# Patient Record
Sex: Male | Born: 1970 | Race: Black or African American | Hispanic: No | Marital: Married | Smoking: Never smoker
Health system: Southern US, Community
[De-identification: ages and names within clinical notes are randomized; demographics above are authoritative.]

## PROBLEM LIST (undated history)

## (undated) DIAGNOSIS — T7840XA Allergy, unspecified, initial encounter: Secondary | ICD-10-CM

## (undated) DIAGNOSIS — Z8619 Personal history of other infectious and parasitic diseases: Secondary | ICD-10-CM

## (undated) DIAGNOSIS — I10 Essential (primary) hypertension: Secondary | ICD-10-CM

## (undated) HISTORY — PX: WISDOM TOOTH EXTRACTION: SHX21

## (undated) HISTORY — PX: NASAL SINUS SURGERY: SHX719

## (undated) HISTORY — DX: Allergy, unspecified, initial encounter: T78.40XA

## (undated) HISTORY — DX: Personal history of other infectious and parasitic diseases: Z86.19

## (undated) HISTORY — DX: Essential (primary) hypertension: I10

---

## 1999-10-31 ENCOUNTER — Emergency Department (HOSPITAL_COMMUNITY): Admission: EM | Admit: 1999-10-31 | Discharge: 1999-10-31 | Payer: Self-pay | Admitting: Emergency Medicine

## 2004-08-29 ENCOUNTER — Ambulatory Visit (HOSPITAL_COMMUNITY): Admission: RE | Admit: 2004-08-29 | Discharge: 2004-08-29 | Payer: Self-pay | Admitting: Trauma Surgery

## 2005-07-11 ENCOUNTER — Emergency Department (HOSPITAL_COMMUNITY): Admission: EM | Admit: 2005-07-11 | Discharge: 2005-07-12 | Payer: Self-pay | Admitting: *Deleted

## 2006-03-12 ENCOUNTER — Ambulatory Visit: Payer: Self-pay | Admitting: Family Medicine

## 2006-03-12 LAB — CONVERTED CEMR LAB
BUN: 11 mg/dL (ref 6–23)
CO2: 32 meq/L (ref 19–32)
GFR calc non Af Amer: 81 mL/min
Glomerular Filtration Rate, Af Am: 99 mL/min/{1.73_m2}
Glucose, Bld: 85 mg/dL (ref 70–99)

## 2008-06-02 ENCOUNTER — Ambulatory Visit: Payer: Self-pay | Admitting: Family Medicine

## 2008-06-02 ENCOUNTER — Encounter (INDEPENDENT_AMBULATORY_CARE_PROVIDER_SITE_OTHER): Payer: Self-pay | Admitting: *Deleted

## 2008-06-02 DIAGNOSIS — I10 Essential (primary) hypertension: Secondary | ICD-10-CM | POA: Insufficient documentation

## 2008-06-02 DIAGNOSIS — Z9189 Other specified personal risk factors, not elsewhere classified: Secondary | ICD-10-CM | POA: Insufficient documentation

## 2008-06-02 HISTORY — DX: Other specified personal risk factors, not elsewhere classified: Z91.89

## 2008-06-03 ENCOUNTER — Encounter: Payer: Self-pay | Admitting: Family Medicine

## 2008-06-07 ENCOUNTER — Ambulatory Visit: Payer: Self-pay | Admitting: Pulmonary Disease

## 2008-06-14 ENCOUNTER — Encounter: Payer: Self-pay | Admitting: Pulmonary Disease

## 2008-06-14 ENCOUNTER — Ambulatory Visit (HOSPITAL_BASED_OUTPATIENT_CLINIC_OR_DEPARTMENT_OTHER): Admission: RE | Admit: 2008-06-14 | Discharge: 2008-06-14 | Payer: Self-pay | Admitting: Pulmonary Disease

## 2008-06-23 ENCOUNTER — Ambulatory Visit: Payer: Self-pay | Admitting: Family Medicine

## 2008-06-24 ENCOUNTER — Ambulatory Visit: Payer: Self-pay | Admitting: Pulmonary Disease

## 2008-06-28 ENCOUNTER — Telehealth (INDEPENDENT_AMBULATORY_CARE_PROVIDER_SITE_OTHER): Payer: Self-pay | Admitting: *Deleted

## 2008-07-08 ENCOUNTER — Ambulatory Visit: Payer: Self-pay | Admitting: Family Medicine

## 2008-07-08 LAB — CONVERTED CEMR LAB
Alkaline Phosphatase: 57 units/L (ref 39–117)
BUN: 12 mg/dL (ref 6–23)
Bilirubin, Direct: 0.1 mg/dL (ref 0.0–0.3)
CO2: 32 meq/L (ref 19–32)
Calcium: 9.1 mg/dL (ref 8.4–10.5)
Chloride: 103 meq/L (ref 96–112)
Cholesterol: 179 mg/dL (ref 0–200)
Creatinine, Ser: 1.2 mg/dL (ref 0.4–1.5)
Glucose, Bld: 104 mg/dL — ABNORMAL HIGH (ref 70–99)
Sodium: 139 meq/L (ref 135–145)

## 2008-07-14 ENCOUNTER — Ambulatory Visit: Payer: Self-pay | Admitting: Pulmonary Disease

## 2008-07-14 DIAGNOSIS — G4733 Obstructive sleep apnea (adult) (pediatric): Secondary | ICD-10-CM | POA: Insufficient documentation

## 2008-07-14 HISTORY — DX: Obstructive sleep apnea (adult) (pediatric): G47.33

## 2008-07-29 ENCOUNTER — Ambulatory Visit: Payer: Self-pay | Admitting: Family Medicine

## 2008-10-07 ENCOUNTER — Ambulatory Visit: Payer: Self-pay | Admitting: Family Medicine

## 2008-10-13 ENCOUNTER — Ambulatory Visit: Payer: Self-pay | Admitting: Family Medicine

## 2008-10-13 LAB — CONVERTED CEMR LAB
ALT: 24 units/L (ref 0–53)
Albumin: 4.1 g/dL (ref 3.5–5.2)
Bilirubin, Direct: 0 mg/dL (ref 0.0–0.3)
Calcium: 9 mg/dL (ref 8.4–10.5)
Chloride: 106 meq/L (ref 96–112)
Creatinine, Ser: 1.3 mg/dL (ref 0.4–1.5)
GFR calc non Af Amer: 79.95 mL/min (ref >60)
HDL: 33.4 mg/dL — ABNORMAL LOW (ref >39.00)
LDL Cholesterol: 119 mg/dL — ABNORMAL HIGH (ref 0–99)
Potassium: 4 meq/L (ref 3.5–5.1)
Total CHOL/HDL Ratio: 5
Triglycerides: 72 mg/dL (ref 0.0–149.0)

## 2008-10-27 ENCOUNTER — Ambulatory Visit: Payer: Self-pay | Admitting: Family Medicine

## 2010-05-21 ENCOUNTER — Emergency Department (HOSPITAL_BASED_OUTPATIENT_CLINIC_OR_DEPARTMENT_OTHER)
Admission: EM | Admit: 2010-05-21 | Discharge: 2010-05-21 | Payer: Self-pay | Source: Home / Self Care | Admitting: Emergency Medicine

## 2010-05-31 ENCOUNTER — Encounter: Payer: Self-pay | Admitting: Internal Medicine

## 2010-05-31 ENCOUNTER — Ambulatory Visit
Admission: RE | Admit: 2010-05-31 | Discharge: 2010-05-31 | Payer: Self-pay | Source: Home / Self Care | Attending: Internal Medicine | Admitting: Internal Medicine

## 2010-05-31 DIAGNOSIS — B350 Tinea barbae and tinea capitis: Secondary | ICD-10-CM | POA: Insufficient documentation

## 2010-05-31 HISTORY — DX: Tinea barbae and tinea capitis: B35.0

## 2010-06-27 ENCOUNTER — Encounter: Payer: Self-pay | Admitting: Internal Medicine

## 2010-06-28 ENCOUNTER — Encounter: Payer: Self-pay | Admitting: Internal Medicine

## 2010-06-28 LAB — CONVERTED CEMR LAB
ALT: 27 units/L (ref 0–53)
AST: 26 units/L (ref 0–37)
Albumin: 4.4 g/dL (ref 3.5–5.2)
Alkaline Phosphatase: 65 units/L (ref 39–117)
Bilirubin, Direct: 0.1 mg/dL (ref 0.0–0.3)
Chloride: 102 meq/L (ref 96–112)
Glucose, Bld: 93 mg/dL (ref 70–99)
LDL Cholesterol: 112 mg/dL — ABNORMAL HIGH (ref 0–99)
Potassium: 3.7 meq/L (ref 3.5–5.3)
Sodium: 140 meq/L (ref 135–145)
Total Bilirubin: 0.4 mg/dL (ref 0.3–1.2)
Total Protein: 7.4 g/dL (ref 6.0–8.3)
VLDL: 20 mg/dL (ref 0–40)

## 2010-06-28 NOTE — Assessment & Plan Note (Signed)
Summary: NEW TO BE EST [facility] WENT TO [facility]   Vital Signs:  Patient profile:   40 year old male Height:      72 inches Weight:      235.31 pounds BMI:     32.03 Temp:     97.8 degrees F oral Pulse rate:   96 / minute Pulse rhythm:   regular Resp:     16 per minute BP sitting:   172 / 118  (right arm) Cuff size:   large  Vitals Entered By: Mervin Kung CMA (AAMA) (May 31, 2010 11:08 AM) CC: Pt here to establish care. Referred by ED for elevated BP. Is Patient Diabetic? No Pain Assessment Patient in pain? no      Comments Pt has history of hypertension. Had stopped all meds as he had decreased some stress in his life. BP check at home were reported normal until recently.  Mervin Kung CMA Duncan Dull)  May 31, 2010 11:16 AM    Primary Care Provider:  Dondra Spry DO  CC:  Pt here to establish care. Referred by ED for elevated BP.Marland Kitchen  History of Present Illness: 40 y/o male to establish  hx of htn - started meds 10 yrs ago  Dec - stepfather was hospitalized increased stress  occ takes aleve for headaches  Preventive Screening-Counseling & Management  Alcohol-Tobacco     Alcohol drinks/day: 0     Smoking Status: never  Caffeine-Diet-Exercise     Caffeine use/day: none     Does Patient Exercise: no  Allergies (verified): No Known Drug Allergies  Past History:  Past Surgical History: Sinus surgery-- 1996     Family History: Family History Hypertension M uncle--MI in 62s  mother and father in mid 2's - healthy no cancer  Social History: Occupation: Engineer, drilling Married -9 yrs [place] up [place] 3 children ( 2 daughter  2, 39, son 5) Never Smoked Alcohol use-no  Drug use-no Regular exercise-no Caffeine use/day:  none  Review of Systems  The patient denies weight loss, chest pain, syncope, dyspnea on exertion, prolonged cough, abdominal pain, melena, hematochezia, severe indigestion/heartburn, and depression.     Physical Exam  General:  alert, well-developed, and well-nourished.   Head:  normocephalic and atraumatic.   Eyes:  pupils equal, pupils round, and pupils reactive to light.   Ears:  R ear normal and L ear normal.   Mouth:  pharynx pink and moist.   Neck:  No deformities, masses, or tenderness noted.no carotid bruits.   Lungs:  normal respiratory effort, normal breath sounds, no crackles, and no wheezes.   Heart:  normal rate, regular rhythm, no murmur, and no gallop.   Abdomen:  soft, non-tender, normal bowel sounds, no masses, and no guarding.   Neurologic:  cranial nerves II-XII intact and gait normal.   Skin:  scattered ruddy appearance of bilateral face    Impression & Recommendations:  Problem # 1:  HYPERTENSION (ICD-401.9) restart meds diovan / hct The following medications were removed from the medication list:    Valturna 300-320 Mg Tabs (Aliskiren-valsartan) .Marland Kitchen... 1 by mouth once daily    Hydrochlorothiazide 25 Mg Tabs (Hydrochlorothiazide) .Marland Kitchen... 1 by mouth once daily His updated medication list for this problem includes:    Diovan Hct 160-25 Mg Tabs (Valsartan-hydrochlorothiazide) ..... One by mouth once daily  Orders: EKG w/ Interpretation (93000)  BP today: 172/118 Prior BP: 130/90 (10/27/2008)  Labs Reviewed: K+: 4.0 (10/07/2008) Creat: : 1.3 (  10/07/2008)   Chol: 167 (10/07/2008)   HDL: 33.40 (10/07/2008)   LDL: 119 (10/07/2008)   TG: 72.0 (10/07/2008)  Problem # 2:  TINEA BARBAE (ICD-110.0)  Complete Medication List: 1)  Diovan Hct 160-25 Mg Tabs (Valsartan-hydrochlorothiazide) .... One by mouth once daily 2)  Metronidazole 0.75 % Gel (Metronidazole) .... Apply twice daily  Patient Instructions: 1)  Please schedule a follow-up appointment in 1 month. 2)  BMP prior to visit, ICD-9: 401.9 3)  Hepatic Panel prior to visit, ICD-9: 401.9 4)  Lipid Panel prior to visit, ICD-9: 401.9 5)  HbgA1C prior to visit,  ICD -9:  790.29 6)  Please return for lab work  one (1) week before your next appointment.  Prescriptions: METRONIDAZOLE 0.75 % GEL (METRONIDAZOLE) apply twice daily  #1 month x 2   Entered and Authorized by:   D. Thomos Lemons DO   Signed by:   D. Thomos Lemons DO on 05/31/2010   Method used:   Electronically to        [facility] [address] [phone]* (retail)       [address]       [place], [place]  [address]       Ph: [phone] or [phone]       Fax: [phone]   RxID:   [id number] DIOVAN HCT 160-25 MG TABS (VALSARTAN-HYDROCHLOROTHIAZIDE) one by mouth once daily  #90 x 1   Entered and Authorized by:   D. Thomos Lemons DO   Signed by:   D. Thomos Lemons DO on 05/31/2010   Method used:   Print then Give to Patient   RxID:   [id number]    Orders Added: 1)  EKG w/ Interpretation [93000] 2)  New Patient Level III [99203]    Contraindications/Deferment of Procedures/Staging:    Test/Procedure: FLU VAX    Reason for deferment: patient declined    Preventive Care Screening     Pt unsure of last tetanus shot. Nicki Guadalajara Fergerson CMA (AAMA)  May 31, 2010 11:20 AM     Current Allergies (reviewed today): No known allergies

## 2010-07-02 ENCOUNTER — Encounter: Payer: Self-pay | Admitting: Internal Medicine

## 2010-07-02 ENCOUNTER — Ambulatory Visit (INDEPENDENT_AMBULATORY_CARE_PROVIDER_SITE_OTHER): Payer: BC Managed Care – PPO | Admitting: Internal Medicine

## 2010-07-02 DIAGNOSIS — I10 Essential (primary) hypertension: Secondary | ICD-10-CM

## 2010-07-04 NOTE — Miscellaneous (Signed)
Summary: lab order  Clinical Lists Changes  Orders: Added new Test order of T-Basic Metabolic Panel [phone]) - Signed Added new Test order of T-Hepatic Function [phone]) - Signed Added new Test order of T-Lipid Profile [phone]) - Signed Added new Test order of T- Hemoglobin A1C ([id number]) - Signed

## 2010-07-24 NOTE — Assessment & Plan Note (Signed)
Summary: 1 month follow up hea   Vital Signs:  Patient profile:   40 year old male Height:      72 inches Weight:      232 pounds BMI:     31.58 O2 Sat:      100 % on Room air Pulse rate:   52 / minute Pulse rhythm:   regular Resp:     20 per minute BP sitting:   152 / 104  (left arm) Cuff size:   large  Vitals Entered By: Francee Piccolo CMA Duncan Dull) (July 02, 2010 9:58 AM)  O2 Flow:  Room air CC: follow-up visit for BP, pt ran out of meds on Saturday, BP continues to be elevated on home monitor Is Patient Diabetic? No   Primary Care Provider:  Dondra Spry DO  CC:  follow-up visit for BP, pt ran out of meds on Saturday, and BP continues to be elevated on home monitor.  History of Present Illness:  Hypertension Follow-Up      This is a 40 year old man who presents for Hypertension follow-up.  The patient denies lightheadedness and headaches.  The patient denies the following associated symptoms: chest pain.  He ran out of sample bp meds on sat.  he never got rx filled for diovan / hctz.  he lost rx he was not sure whether we were going to continue diovan because it did't completely control his BP  tinea barbae - improved with using metronidazole gel  Current Medications (verified): 1)  Diovan Hct 160-25 Mg Tabs (Valsartan-Hydrochlorothiazide) .... One By Mouth Once Daily  Allergies (verified): No Known Drug Allergies  Past History:  Past Medical History: Hypertension allergic rhinitis   Past Surgical History: Sinus surgery-- 1996      Family History: Family History Hypertension M uncle--MI in 61s  mother and father in mid 55's - healthy no cancer   Social History: Occupation: Engineer, drilling Married -9 yrs Grew up in [place] 3 children ( 2 daughter  73, 43, son 5)  Never Smoked Alcohol use-no  Drug use-no Regular exercise-no  Physical Exam  General:  alert, well-developed, and well-nourished.   Lungs:  normal respiratory  effort, normal breath sounds, no crackles, and no wheezes.   Heart:  normal rate, regular rhythm, no murmur, and no gallop.   Extremities:  No lower extremity edema    Impression & Recommendations:  Problem # 1:  HYPERTENSION (ICD-401.9) Assessment Improved add amlodipine  His updated medication list for this problem includes:    Diovan Hct 160-25 Mg Tabs (Valsartan-hydrochlorothiazide) ..... One by mouth once daily    Amlodipine Besylate 5 Mg Tabs (Amlodipine besylate) ..... One by mouth once daily  BP today: 152/104 Prior BP: 172/118 (05/31/2010)  Labs Reviewed: K+: 3.7 (06/28/2010) Creat: : 1.40 (06/28/2010)   Chol: 172 (06/28/2010)   HDL: 40 (06/28/2010)   LDL: 112 (06/28/2010)   TG: 101 (06/28/2010)  Complete Medication List: 1)  Diovan Hct 160-25 Mg Tabs (Valsartan-hydrochlorothiazide) .... One by mouth once daily 2)  Amlodipine Besylate 5 Mg Tabs (Amlodipine besylate) .... One by mouth once daily  Patient Instructions: 1)  Please schedule a follow-up appointment in 2 months. Prescriptions: AMLODIPINE BESYLATE 5 MG TABS (AMLODIPINE BESYLATE) one by mouth once daily  #30 x 3   Entered and Authorized by:   D. Thomos Lemons DO   Signed by:   D. Thomos Lemons DO on 07/02/2010   Method used:   Print then Give  to Patient   RxID:   [id number] DIOVAN HCT 160-25 MG TABS (VALSARTAN-HYDROCHLOROTHIAZIDE) one by mouth once daily  #90 x 1   Entered and Authorized by:   D. Thomos Lemons DO   Signed by:   D. Thomos Lemons DO on 07/02/2010   Method used:   Print then Give to Patient   RxID:   [id number]    Orders Added: 1)  Est. Patient Level III [76283]

## 2010-08-06 LAB — BASIC METABOLIC PANEL
Calcium: 9.6 mg/dL (ref 8.4–10.5)
GFR calc Af Amer: >60 mL/min (ref >60)
GFR calc non Af Amer: 57 mL/min — ABNORMAL LOW (ref >60)
Potassium: 4.1 mEq/L (ref 3.5–5.1)
Sodium: 143 mEq/L (ref 135–145)

## 2010-08-09 ENCOUNTER — Encounter: Payer: Self-pay | Admitting: Internal Medicine

## 2010-08-29 ENCOUNTER — Encounter: Payer: Self-pay | Admitting: Internal Medicine

## 2010-08-29 ENCOUNTER — Other Ambulatory Visit: Payer: Self-pay | Admitting: Internal Medicine

## 2010-08-29 ENCOUNTER — Ambulatory Visit (INDEPENDENT_AMBULATORY_CARE_PROVIDER_SITE_OTHER): Payer: BC Managed Care – PPO | Admitting: Internal Medicine

## 2010-08-29 DIAGNOSIS — I10 Essential (primary) hypertension: Secondary | ICD-10-CM

## 2010-08-29 DIAGNOSIS — E785 Hyperlipidemia, unspecified: Secondary | ICD-10-CM

## 2010-08-29 HISTORY — DX: Hyperlipidemia, unspecified: E78.5

## 2010-08-29 MED ORDER — AMLODIPINE BESYLATE 5 MG PO TABS: 5.0000 mg | ORAL_TABLET | Freq: Every day | ORAL | Status: DC

## 2010-08-29 MED ORDER — VALSARTAN-HYDROCHLOROTHIAZIDE 160-25 MG PO TABS: 1.0000 | ORAL_TABLET | Freq: Every day | ORAL | Status: DC

## 2010-08-29 NOTE — Assessment & Plan Note (Signed)
BP at goal.  Continue current medication regimen.  BP Readings from Last 3 Encounters:  08/29/10 130/82  07/02/10 152/104  05/31/10 172/118   Lab Results  Component Value Date   CREATININE 1.40 06/28/2010   Pt understands to avoid NSAIDs.  He stopped using protein shake supplement.   Monitor BMET

## 2010-08-29 NOTE — Assessment & Plan Note (Signed)
LDL goal < 100. Reviewed dietary changes  Lab Results  Component Value Date   CHOL 172 06/28/2010   CHOL 167 10/07/2008   CHOL 179 06/23/2008   Lab Results  Component Value Date   HDL 40 06/28/2010   HDL 33.40* 10/07/2008   HDL 36.9* 06/23/2008   Lab Results  Component Value Date   LDLCALC 112* 06/28/2010   LDLCALC 119* 10/07/2008   LDLCALC 129* 06/23/2008   Lab Results  Component Value Date   TRIG 101 06/28/2010   TRIG 72.0 10/07/2008   TRIG 68 06/23/2008   Lab Results  Component Value Date   CHOLHDL 4.3 Ratio 06/28/2010   CHOLHDL 5 10/07/2008   CHOLHDL 4.9 CALC 06/23/2008

## 2010-08-29 NOTE — Patient Instructions (Signed)
Please complete the following blood work before your next appointment BMET - 401.9 FLP, CRP - 272.4

## 2010-08-29 NOTE — Progress Notes (Signed)
  Subjective:    Patient ID: Steven Roth, male    DOB: 1971/01/06, 40 y.o.   MRN: [id number]  HPI 40 y/o AA male for follow up  Htn - good med compliance. No adverse effect noted.  Avoids salty diet.  No regular exercise   Review of Systems No SOB,  No chest pain    Past Medical History  Diagnosis Date  . Allergy   . Hypertension     History   Social History  . Marital Status: Married    Spouse Name: N/A    Number of Children: N/A  . Years of Education: N/A   Occupational History  . Not on file.   Social History Main Topics  . Smoking status: Never Smoker   . Smokeless tobacco: Not on file  . Alcohol Use: No  . Drug Use: No  . Sexually Active:    Other Topics Concern  . 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 Nasal sinus surgery     1996    Family History  Problem Relation Age of Onset  . Hypertension      family history  . Heart attack      maternal uncle in 78's    No Known Allergies  Current Outpatient Prescriptions on File Prior to Visit  Medication Sig Dispense Refill  . amLODipine (NORVASC) 5 MG tablet Take 5 mg by mouth daily.        . valsartan-hydrochlorothiazide (DIOVAN HCT) 160-25 MG per tablet Take 1 tablet by mouth daily.          BP 130/90  Pulse 60  Temp(Src) 98.4 F (36.9 C) (Oral)  Resp 16  Ht 6' (1.829 m)  Wt 233 lb (105.688 kg)  BMI 31.60 kg/m2  SpO2 100%    Objective:   Physical Exam  Constitutional: He appears well-developed and well-nourished.  Neck: Neck supple.  Cardiovascular: Normal rate, regular rhythm and normal heart sounds.   Pulmonary/Chest: Effort normal and breath sounds normal.          Assessment & Plan:

## 2010-10-09 NOTE — Procedures (Signed)
Steven Roth, WYNE NO.:  [id number]   MEDICAL RECORD NO.:  [id number]          PATIENT TYPE:  OUT   LOCATION:  SLEEP CENTER                 FACILITY:  [facility]   PHYSICIAN:  Barbaraann Share, MD,FCCPDATE OF BIRTH:  07-28-71   DATE OF STUDY:  06/14/2008                            NOCTURNAL POLYSOMNOGRAM   REFERRING PHYSICIAN:  Barbaraann Share, MD,FCCP   LOCATION:  Sleep Lab.   INDICATION FOR STUDY:  Hypersomnia with sleep apnea.   EPWORTH SLEEPINESS SCORE:  2.   MEDICATIONS:   SLEEP ARCHITECTURE:  The patient had total sleep time of 354 minutes  with no slow wave sleep and only 55 minutes of REM.  Sleep onset latency  was normal at 24 minutes, and REM onset was normal at 69 minutes.  Sleep  efficiency was fairly good at 93%.   RESPIRATORY DATA:  The patient was found to have 6 apneas and 16  hypopneas for an AHI of 4 events per hour.  In addition, he was found to  have 24 respiratory effort-related arousals, giving him a RDI of 8  events per hour.  Events occurred in all body positions, but were  definitely increased in frequency during REM.   OXYGEN DATA:  The patient had O2 desaturation as low as 89% with his  obstructive events.   CARDIAC DATA:  No clinically significant arrhythmias were seen.   MOVEMENT-PARASOMNIA:  No significant leg jerks or abnormal behaviors  were present.   IMPRESSIONS-RECOMMENDATIONS:  Very mild obstructive sleep apnea/hypopnea  syndrome with an apnea-hypopnea index of 4 events per hour, and a  respiratory disturbance index of 8 events per hour.  There was oxygen  desaturation as low as 89%.  It should be noted the events were much  more frequent during REM.  Treatment for  this degree of sleep apnea can include weight loss alone if applicable,  upper airway surgery, oral appliance, and also continuous positive  airway pressure.  Clinical correlation is suggested.      Barbaraann Share, MD,FCCP  Diplomate, American  Board of Sleep  Medicine  Electronically Signed     [facility]/MEDQ  D:  06/25/2008 14:36:47  T:  06/26/2008 03:27:57  Job:  [id number]

## 2011-02-25 ENCOUNTER — Encounter: Payer: Self-pay | Admitting: Internal Medicine

## 2011-02-25 ENCOUNTER — Other Ambulatory Visit: Payer: Self-pay | Admitting: Internal Medicine

## 2011-02-25 ENCOUNTER — Ambulatory Visit (INDEPENDENT_AMBULATORY_CARE_PROVIDER_SITE_OTHER): Payer: BC Managed Care – PPO | Admitting: Internal Medicine

## 2011-02-25 VITALS — BP 120/84 | HR 68 | Temp 98.6°F | Ht 72.0 in | Wt 236.0 lb

## 2011-02-25 DIAGNOSIS — I10 Essential (primary) hypertension: Secondary | ICD-10-CM

## 2011-02-25 DIAGNOSIS — N289 Disorder of kidney and ureter, unspecified: Secondary | ICD-10-CM

## 2011-02-25 MED ORDER — VALSARTAN-HYDROCHLOROTHIAZIDE 160-25 MG PO TABS: 1.0000 | ORAL_TABLET | Freq: Every day | ORAL | Status: AC

## 2011-02-25 MED ORDER — AMLODIPINE BESYLATE 10 MG PO TABS: 10.0000 mg | ORAL_TABLET | Freq: Every day | ORAL | Status: AC

## 2011-02-25 NOTE — Assessment & Plan Note (Signed)
Well controlled.  Pt notes he is using discount card for diovan.  Once it runs out, medication may be cost prohibitive We discussed switching to losartan 100mg  and hctz 25 mg. BP: 120/84 mmHg  Lab Results  Component Value Date   CREATININE 1.40 06/28/2010

## 2011-02-25 NOTE — Progress Notes (Signed)
  Subjective:    Patient ID: Steven Roth, male    DOB: 12-Dec-1970, 40 y.o.   MRN: [id number]  Hypertension This is a chronic problem. The current episode started more than 1 year ago. The problem is unchanged. The problem is controlled. Pertinent negatives include no anxiety, chest pain, headaches or shortness of breath. There are no associated agents to hypertension. Risk factors for coronary artery disease include male gender. Past treatments include angiotensin blockers, calcium channel blockers and diuretics. The current treatment provides moderate improvement. There are no compliance problems.       Review of Systems  Respiratory: Negative for shortness of breath.   Cardiovascular: Negative for chest pain.  Neurological: Negative for headaches.   Past Medical History  Diagnosis Date  . Allergy   . Hypertension     History   Social History  . Marital Status: Married    Spouse Name: N/A    Number of Children: N/A  . Years of Education: N/A   Occupational History  . Not on file.   Social History Main Topics  . Smoking status: Never Smoker   . Smokeless tobacco: Not on file  . Alcohol Use: No  . Drug Use: No  . Sexually Active:    Other Topics Concern  . 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 Nasal sinus surgery     1996    Family History  Problem Relation Age of Onset  . Hypertension      family history  . Heart attack      maternal uncle in 16's    No Known Allergies  No current outpatient prescriptions on file prior to visit.    BP 120/84  Pulse 68  Temp(Src) 98.6 F (37 C) (Oral)  Ht 6' (1.829 m)  Wt 236 lb (107.049 kg)  BMI 32.01 kg/m2       Objective:   Physical Exam   Constitutional: Appears well-developed and well-nourished. No  distress.  Neck: Normal range of motion. Neck supple. No thyromegaly present. No carotid bruit Cardiovascular: Normal rate, regular rhythm and normal heart sounds.  Exam reveals no gallop and no friction rub.   No murmur heard. Pulmonary/Chest: Effort normal and breath sounds normal.  No wheezes. No rales.  Skin: Skin is warm and dry.  Psychiatric: Normal mood and affect. Behavior is normal.     Assessment & Plan:

## 2011-02-26 ENCOUNTER — Ambulatory Visit: Payer: BC Managed Care – PPO | Admitting: Internal Medicine

## 2011-02-26 LAB — BASIC METABOLIC PANEL
BUN: 13 mg/dL (ref 6–23)
Calcium: 9.8 mg/dL (ref 8.4–10.5)
Creatinine, Ser: 1.7 mg/dL — ABNORMAL HIGH (ref 0.4–1.5)
GFR: 59.13 mL/min — ABNORMAL LOW (ref >60.00)
Sodium: 143 mEq/L (ref 135–145)

## 2011-02-26 LAB — LDL CHOLESTEROL, DIRECT: Direct LDL: 148.7 mg/dL

## 2011-02-26 LAB — LIPID PANEL: Triglycerides: 98 mg/dL (ref 0.0–149.0)

## 2011-02-27 DIAGNOSIS — N289 Disorder of kidney and ureter, unspecified: Secondary | ICD-10-CM | POA: Insufficient documentation

## 2011-02-27 HISTORY — DX: Disorder of kidney and ureter, unspecified: N28.9

## 2011-02-27 NOTE — Progress Notes (Signed)
Addended by: Simeon Craft on: 02/27/2011 04:57 PM   Modules accepted: Orders

## 2011-02-27 NOTE — Assessment & Plan Note (Addendum)
02/27/11  Pt with unexplained rise in Cr. Pt advised to stop diovan/hctz Arrange renal u/s  03/01/11 Renal u/s is normal

## 2011-02-28 ENCOUNTER — Ambulatory Visit (HOSPITAL_BASED_OUTPATIENT_CLINIC_OR_DEPARTMENT_OTHER)
Admission: RE | Admit: 2011-02-28 | Discharge: 2011-02-28 | Disposition: A | Discharge status: Home / Self Care | Payer: BC Managed Care – PPO | Source: Ambulatory Visit | Attending: Internal Medicine | Admitting: Internal Medicine

## 2011-02-28 DIAGNOSIS — N289 Disorder of kidney and ureter, unspecified: Secondary | ICD-10-CM | POA: Insufficient documentation

## 2011-02-28 DIAGNOSIS — I10 Essential (primary) hypertension: Secondary | ICD-10-CM

## 2011-03-04 ENCOUNTER — Other Ambulatory Visit: Payer: Self-pay | Admitting: Internal Medicine

## 2011-03-04 DIAGNOSIS — N289 Disorder of kidney and ureter, unspecified: Secondary | ICD-10-CM

## 2011-03-05 ENCOUNTER — Other Ambulatory Visit (INDEPENDENT_AMBULATORY_CARE_PROVIDER_SITE_OTHER): Payer: BC Managed Care – PPO

## 2011-03-05 DIAGNOSIS — N289 Disorder of kidney and ureter, unspecified: Secondary | ICD-10-CM

## 2011-03-05 LAB — POCT URINALYSIS DIPSTICK
Bilirubin, UA: NEGATIVE
Ketones, UA: NEGATIVE
Leukocytes, UA: NEGATIVE
Urobilinogen, UA: 0.2

## 2011-03-08 ENCOUNTER — Ambulatory Visit: Payer: BC Managed Care – PPO | Admitting: Internal Medicine

## 2011-03-13 NOTE — Progress Notes (Signed)
Was this pt referred to nephrologist

## 2011-03-20 ENCOUNTER — Ambulatory Visit: Payer: BC Managed Care – PPO | Admitting: Internal Medicine

## 2011-08-18 ENCOUNTER — Other Ambulatory Visit: Payer: Self-pay | Admitting: Internal Medicine

## 2015-12-09 ENCOUNTER — Ambulatory Visit: Payer: Self-pay | Admitting: Family Medicine

## 2016-02-19 ENCOUNTER — Encounter (HOSPITAL_BASED_OUTPATIENT_CLINIC_OR_DEPARTMENT_OTHER): Payer: Self-pay | Admitting: Emergency Medicine

## 2016-02-19 ENCOUNTER — Emergency Department (HOSPITAL_BASED_OUTPATIENT_CLINIC_OR_DEPARTMENT_OTHER): Payer: BLUE CROSS/BLUE SHIELD

## 2016-02-19 ENCOUNTER — Emergency Department (HOSPITAL_BASED_OUTPATIENT_CLINIC_OR_DEPARTMENT_OTHER)
Admission: EM | Admit: 2016-02-19 | Discharge: 2016-02-19 | Disposition: A | Discharge status: Home / Self Care | Payer: BLUE CROSS/BLUE SHIELD | Attending: Emergency Medicine | Admitting: Emergency Medicine

## 2016-02-19 DIAGNOSIS — I10 Essential (primary) hypertension: Secondary | ICD-10-CM | POA: Diagnosis not present

## 2016-02-19 DIAGNOSIS — N289 Disorder of kidney and ureter, unspecified: Secondary | ICD-10-CM | POA: Diagnosis not present

## 2016-02-19 DIAGNOSIS — N390 Urinary tract infection, site not specified: Secondary | ICD-10-CM | POA: Insufficient documentation

## 2016-02-19 DIAGNOSIS — R8281 Pyuria: Secondary | ICD-10-CM

## 2016-02-19 DIAGNOSIS — Z79899 Other long term (current) drug therapy: Secondary | ICD-10-CM | POA: Insufficient documentation

## 2016-02-19 DIAGNOSIS — L03314 Cellulitis of groin: Secondary | ICD-10-CM | POA: Insufficient documentation

## 2016-02-19 DIAGNOSIS — N4889 Other specified disorders of penis: Secondary | ICD-10-CM | POA: Diagnosis present

## 2016-02-19 DIAGNOSIS — D72819 Decreased white blood cell count, unspecified: Secondary | ICD-10-CM | POA: Insufficient documentation

## 2016-02-19 LAB — CBC WITH DIFFERENTIAL/PLATELET
BASOS PCT: 0 %
Basophils Absolute: 0 10*3/uL (ref 0.0–0.1)
Eosinophils Absolute: 0.1 10*3/uL (ref 0.0–0.7)
Eosinophils Relative: 3 %
HCT: 41.9 % (ref 39.0–52.0)
HEMOGLOBIN: 15 g/dL (ref 13.0–17.0)
Lymphocytes Relative: 54 %
Lymphs Abs: 1.8 10*3/uL (ref 0.7–4.0)
MCH: 31.8 pg (ref 26.0–34.0)
MCHC: 35.8 g/dL (ref 30.0–36.0)
MCV: 88.8 fL (ref 78.0–100.0)
MONO ABS: 0.4 10*3/uL (ref 0.1–1.0)
MONOS PCT: 13 %
NEUTROS ABS: 1.1 10*3/uL — AB (ref 1.7–7.7)
Neutrophils Relative %: 31 %
Platelets: 181 10*3/uL (ref 150–400)
RBC: 4.72 MIL/uL (ref 4.22–5.81)
RDW: 12.5 % (ref 11.5–15.5)
WBC: 3.4 10*3/uL — ABNORMAL LOW (ref 4.0–10.5)

## 2016-02-19 LAB — URINALYSIS, ROUTINE W REFLEX MICROSCOPIC
BILIRUBIN URINE: NEGATIVE
GLUCOSE, UA: NEGATIVE mg/dL
HGB URINE DIPSTICK: NEGATIVE
KETONES UR: NEGATIVE mg/dL
Nitrite: NEGATIVE
PH: 6 (ref 5.0–8.0)
Protein, ur: NEGATIVE mg/dL
Specific Gravity, Urine: 1.013 (ref 1.005–1.030)

## 2016-02-19 LAB — URINE MICROSCOPIC-ADD ON: RBC / HPF: NONE SEEN RBC/hpf (ref 0–5)

## 2016-02-19 LAB — BASIC METABOLIC PANEL
ANION GAP: 8 (ref 5–15)
BUN: 18 mg/dL (ref 6–20)
CALCIUM: 9.5 mg/dL (ref 8.9–10.3)
CO2: 29 mmol/L (ref 22–32)
CREATININE: 1.44 mg/dL — AB (ref 0.61–1.24)
Chloride: 101 mmol/L (ref 101–111)
GFR calc Af Amer: >60 mL/min (ref >60)
GFR calc non Af Amer: 58 mL/min — ABNORMAL LOW (ref >60)
GLUCOSE: 103 mg/dL — AB (ref 65–99)
Potassium: 3.2 mmol/L — ABNORMAL LOW (ref 3.5–5.1)
Sodium: 138 mmol/L (ref 135–145)

## 2016-02-19 MED ORDER — CEPHALEXIN 500 MG PO CAPS: 500.0000 mg | ORAL_CAPSULE | Freq: Two times a day (BID) | ORAL | 0 refills | Status: DC

## 2016-02-19 MED ORDER — DOXYCYCLINE HYCLATE 100 MG PO CAPS: 100.0000 mg | ORAL_CAPSULE | Freq: Two times a day (BID) | ORAL | 0 refills | Status: DC

## 2016-02-19 MED FILL — DOXYCYCLINE HYC 100 MG CAP: 100 | 21 days supply | Qty: 42 | Fill #0

## 2016-02-19 NOTE — ED Provider Notes (Signed)
[facility]-EMERGENCY DEPT Provider Note   CSN: [id number] Arrival date & time: 02/19/16  0848     History   Chief Complaint Chief Complaint  Patient presents with  . Flank Pain    HPI Steven Roth is a 45 y.o. male who presents to the ED with CC of penis pain and dysuria. He states that 2 weeks ago he developed inguinal lymphadenopathy. This was followed by burning and straining with urination. He is unsure if he has noticed discharge. The patient was seen at the [facility] 5 days ago. He had a negative urine and was treated with 250 mg IM rocephin and 1 gram azithromycin.  Since then he has developed  swelling and pain ont the ventral surface of his penis.  He has never had this before. He has had a prostate exam with his pcp which was normal. He has associated suprapubic pain and now has some achiness in his R flank. He denies fevers, chills, myalgias. He has no lesions on the penis. He recieved unprotected oral sexual intercourse 4 weeks ago.    Flank Pain     Past Medical History:  Diagnosis Date  . Allergy   . Hypertension     Patient Active Problem List   Diagnosis Date Noted  . Renal insufficiency 02/27/2011  . Mild hyperlipidemia 08/29/2010  . TINEA BARBAE 05/31/2010  . OBSTRUCTIVE SLEEP APNEA 07/14/2008  . HYPERTENSION 06/02/2008  . SNORING, HX OF 06/02/2008    Past Surgical History:  Procedure Laterality Date  . NASAL SINUS SURGERY     1996       Home Medications    Prior to Admission medications   Medication Sig Start Date End Date Taking? Authorizing Provider  amLODipine (NORVASC) 5 MG tablet TAKE 1 TABLET EVERY DAY 08/18/11  Yes Doe-Hyun R Artist PaisYoo, DO  valsartan-hydrochlorothiazide (DIOVAN HCT) 160-25 MG per tablet Take 1 tablet by mouth daily. 02/25/11  Yes Doe-Hyun R Artist PaisYoo, DO  amLODipine (NORVASC) 10 MG tablet Take 1 tablet (10 mg total) by mouth daily. 02/25/11   Doe-Hyun R Artist PaisYoo, DO  amLODipine (NORVASC) 5 MG tablet TAKE 1 TABLET BY MOUTH EVERY DAY  02/25/11   Doe-Hyun R Artist PaisYoo, DO  cephALEXin (KEFLEX) 500 MG capsule Take 1 capsule (500 mg total) by mouth 2 (two) times daily. 02/19/16   Sherrice Creekmore, PA-C  DIOVAN HCT 160-25 MG per tablet TAKE 1 TABLET BY MOUTH DAILY. 08/18/11   Doe-Hyun R Artist PaisYoo, DO  doxycycline (VIBRAMYCIN) 100 MG capsule Take 1 capsule (100 mg total) by mouth 2 (two) times daily. 02/19/16   Arthor CaptainAbigail Angee Gupton, PA-C    Family History Family History  Problem Relation Age of Onset  . Hypertension      family history  . Heart attack      maternal uncle in 7940's    Social History Social History  Substance Use Topics  . Smoking status: Never Smoker  . Smokeless tobacco: Never Used  . Alcohol use No     Allergies   Review of patient's allergies indicates no known allergies.   Review of Systems Review of Systems  Genitourinary: Positive for flank pain.   Ten systems reviewed and are negative for acute change, except as noted in the HPI.    Physical Exam Updated Vital Signs BP 137/89 (BP Location: Right Arm)   Pulse (!) 59   Temp 98.2 F (36.8 C) (Oral)   Resp 18   Ht 6' (1.829 m)   Wt 108.4 kg  SpO2 98%   BMI 32.41 kg/m   Physical Exam  Constitutional: He appears well-developed and well-nourished. No distress.  HENT:  Head: Normocephalic and atraumatic.  Eyes: Conjunctivae are normal. No scleral icterus.  Neck: Normal range of motion. Neck supple.  Cardiovascular: Normal rate, regular rhythm and normal heart sounds.   Pulmonary/Chest: Effort normal and breath sounds normal. No respiratory distress.  Abdominal: Soft. There is no tenderness. Hernia confirmed negative in the right inguinal area and confirmed negative in the left inguinal area.  Genitourinary: Right testis shows no mass and no tenderness. Cremasteric reflex is not absent on the right side. Left testis shows no mass and no tenderness. Cremasteric reflex is not absent on the left side. Circumcised. Penile erythema present.      Musculoskeletal: He exhibits no edema.  Lymphadenopathy: Inguinal adenopathy noted on the right and left side.  Neurological: He is alert.  Skin: Skin is warm and dry. He is not diaphoretic.  Psychiatric: His behavior is normal.  Nursing note and vitals reviewed.    ED Treatments / Results  Labs (all labs ordered are listed, but only abnormal results are displayed) Labs Reviewed  URINALYSIS, ROUTINE W REFLEX MICROSCOPIC (NOT AT [facility]) - Abnormal; Notable for the following:       Result Value   Leukocytes, UA TRACE (*)    All other components within normal limits  BASIC METABOLIC PANEL - Abnormal; Notable for the following:    Potassium 3.2 (*)    Glucose, Bld 103 (*)    Creatinine, Ser 1.44 (*)    GFR calc non Af Amer 58 (*)    All other components within normal limits  CBC WITH DIFFERENTIAL/PLATELET - Abnormal; Notable for the following:    WBC 3.4 (*)    Neutro Abs 1.1 (*)    All other components within normal limits  URINE MICROSCOPIC-ADD ON - Abnormal; Notable for the following:    Squamous Epithelial / LPF 0-5 (*)    Bacteria, UA FEW (*)    All other components within normal limits  HIV ANTIBODY (ROUTINE TESTING)  RPR    EKG  EKG Interpretation None       Radiology Ct Renal Stone Study  Result Date: 02/19/2016 CLINICAL DATA:  Patient with right flank pain and burning sensation since 02/14/2016. History of urinary tract infection. EXAM: CT ABDOMEN AND PELVIS WITHOUT CONTRAST TECHNIQUE: Multidetector CT imaging of the abdomen and pelvis was performed following the standard protocol without IV contrast. COMPARISON:  Ultrasound abdomen 02/28/2011 FINDINGS: Lower chest: Normal heart size. Minimal atelectasis within the lung bases. No pleural effusion. Hepatobiliary: Liver is normal in size and contour. Within the hepatic dome there is a 1.3 cm cyst. Too small to characterize 3 mm low-attenuation lesion left hepatic lobe (image 17; series 2). Gallbladder is  unremarkable. Pancreas: Unremarkable Spleen: Unremarkable Adrenals/Urinary Tract: Normal adrenal glands. Kidneys are symmetric in size. No nephroureterolithiasis. No hydronephrosis. Urinary bladder is unremarkable. Mild wall thickening of the urinary bladder. Stomach/Bowel: Sigmoid colonic diverticulosis. No CT evidence for acute diverticulitis. Normal morphology of the stomach. No evidence for bowel obstruction. No free fluid or free intraperitoneal air. Vascular/Lymphatic: Peripheral calcified atherosclerotic plaque involving the abdominal aorta. No retroperitoneal lymphadenopathy. Prominent bilateral inguinal lymph nodes measuring 14 mm on the right (image 82; series 2) and 14 mm on the left (image 80; series 2). Reproductive: Prostate is unremarkable. Other: There is subcutaneous fat stranding along the lower anterior abdominal wall just superior to the base of the penis (  image 83; series 2). Musculoskeletal: Lumbar spine degenerative changes. No aggressive or acute appearing osseous lesions. IMPRESSION: Nonspecific subcutaneous fat stranding within the lower anterior abdominal wall just superior to the base of the penis, concerning for the possibility of cellulitis or other acute infectious process. There are prominent and mildly enlarged bilateral inguinal lymph nodes which are nonspecific however likely reactive in etiology. No nephroureterolithiasis.  No hydronephrosis. Aortic atherosclerosis. Electronically Signed   By: Annia Belt M.D.   On: 02/19/2016 11:12    Procedures Procedures (including critical care time)  Medications Ordered in ED Medications - No data to display   Initial Impression / Assessment and Plan / ED Course  I have reviewed the triage vital signs and the nursing notes.  Pertinent labs & imaging results that were available during my care of the patient were reviewed by me and considered in my medical decision making (see chart for details).  Clinical Course    The  patient has fat stranding on his CT scan. I feel again this is cellulitis. No free air suggestive of NecFasc. 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 The patient is to follow up in 2 days here to check for resolution, f/u with [facility] and PCP. I have discussed lab and/or imaging findings with the patient and answered all questions/concerns to the best of my ability. The patient states that he is aware of the renal insufficiency and is already followed by nephrology.   Final Clinical Impressions(s) / ED Diagnoses   Final diagnoses:  Penile pain  Pyuria  Renal insufficiency  Leukopenia  Cellulitis of groin    New Prescriptions Discharge Medication List as of 02/19/2016 10:37 AM    START taking these medications   Details  cephALEXin (KEFLEX) 500 MG capsule Take 1 capsule (500 mg total) by mouth 2 (two) times daily., Starting Mon 02/19/2016, Print         [place], PA-C 02/20/16 1610    Nelva Nay, MD 02/23/16 [phone]

## 2016-02-19 NOTE — ED Notes (Signed)
PA at bedside.

## 2016-02-19 NOTE — ED Notes (Signed)
Patient transported to CT 

## 2016-02-19 NOTE — Discharge Instructions (Signed)
YOU NEED TO FOLLOW UP WITH BOTH THE UROLOGIST FOR THE SWELLING AND PAIN IN YOUR GENITALS, ALONG WITH YOUR PRIMARY CARE DOCTOR AS SOON AS POSSIBLE (UROLOGY WITHIN THE WEEK, YOUR PHYSICIAN IN THE NEXT 2 WEEKS).

## 2016-02-19 NOTE — ED Triage Notes (Addendum)
C/o R flank pain and burning with urination x 1 week. Pt states he was treated for UTI and has finished abx. Pt reports swelling to his penis which started Friday. Pt also reports episode of dizziness and blurred vision with diaphoresis, denies N/V, episode lasted 15-20 min.

## 2016-02-20 LAB — HIV ANTIBODY (ROUTINE TESTING W REFLEX): HIV SCREEN 4TH GENERATION: NONREACTIVE

## 2016-02-21 LAB — RPR: RPR Ser Ql: NONREACTIVE

## 2016-02-28 ENCOUNTER — Ambulatory Visit: Payer: BLUE CROSS/BLUE SHIELD | Admitting: Family Medicine

## 2016-02-29 ENCOUNTER — Ambulatory Visit (INDEPENDENT_AMBULATORY_CARE_PROVIDER_SITE_OTHER): Payer: BLUE CROSS/BLUE SHIELD | Admitting: Family Medicine

## 2016-02-29 ENCOUNTER — Encounter: Payer: Self-pay | Admitting: Family Medicine

## 2016-02-29 VITALS — BP 110/70 | HR 80 | Temp 98.0°F | Ht 72.0 in | Wt 238.0 lb

## 2016-02-29 DIAGNOSIS — Z09 Encounter for follow-up examination after completed treatment for conditions other than malignant neoplasm: Secondary | ICD-10-CM

## 2016-02-29 DIAGNOSIS — E669 Obesity, unspecified: Secondary | ICD-10-CM | POA: Diagnosis not present

## 2016-02-29 DIAGNOSIS — E876 Hypokalemia: Secondary | ICD-10-CM

## 2016-02-29 DIAGNOSIS — I10 Essential (primary) hypertension: Secondary | ICD-10-CM | POA: Diagnosis not present

## 2016-02-29 NOTE — Progress Notes (Signed)
Pre visit review using our clinic review tool, if applicable. No additional management support is needed unless otherwise documented below in the visit note. 

## 2016-02-29 NOTE — Progress Notes (Signed)
Chief Complaint  Patient presents with  . Establish Care    pt wanted to follow-up on the cellulitis of groin-he states he is doing better.       New Patient Visit SUBJECTIVE: HPI: Steven Roth is an 45 y.o.male who is being seen for establishing care.  The patient was previously seen at with Dr. Artist Pais, 5 years ago.  Here for f/u cellulitis in grown. Placed on Doxy and Keflex. Things are going much better. Denies rash, fevers, spreading redness, or pain.  Hypertension Patient presents for hypertension follow up. He is compliant with medications- Norvasc 10 mg daily, Diovan. Patient has these side effects of medication: none He specifically denies headache, visual changes, chest pain, palpitations, dyspnea, orthopnea, PND or peripheral edema. He is sometimes adhering to a low sodium and low fat diet. He exercises rarely, will do some lifting and running. Admittedly needs to get back into it.   No Known Allergies  Past Medical History:  Diagnosis Date  . Allergy   . History of chicken pox   . Hypertension    Past Surgical History:  Procedure Laterality Date  . NASAL SINUS SURGERY     1996  . WISDOM TOOTH EXTRACTION     Social History   Social History  . Marital status: Married   Social History Main Topics  . Smoking status: Never Smoker  . Smokeless tobacco: Never Used  . Alcohol use No  . Drug use: No   Family History  Problem Relation Age of Onset  . Hypertension Mother   . Diabetes Mother   . Hypertension      family history  . Heart attack      maternal uncle in 42's  . Heart disease Maternal Uncle     Current Outpatient Prescriptions:  .  amLODipine (NORVASC) 10 MG tablet, Take 1 tablet (10 mg total) by mouth daily., Disp: 90 tablet, Rfl: 3 .  doxycycline (VIBRAMYCIN) 100 MG capsule, Take 1 capsule (100 mg total) by mouth 2 (two) times daily., Disp: 42 capsule, Rfl: 0 .  valsartan-hydrochlorothiazide (DIOVAN HCT) 160-25 MG per tablet, Take 1 tablet by  mouth daily., Disp: 90 tablet, Rfl: 3  ROS Cardiovascular: Denies chest pain  Respiratory: Denies dyspnea   OBJECTIVE: BP 110/70 (BP Location: Left Arm, Patient Position: Sitting, Cuff Size: Large)   Pulse 80   Temp 98 F (36.7 C) (Oral)   Ht 6' (1.829 m)   Wt 238 lb (108 kg)   SpO2 97%   BMI 32.28 kg/m   Constitutional: -  VS reviewed -  Well developed, well nourished, appears stated age -  No apparent distress  Psychiatric: -  Oriented to person, place, and time -  Memory intact -  Affect and mood normal -  Fluent conversation, good eye contact -  Judgment and insight age appropriate  Eye: -  Conjunctivae clear, no discharge -  Pupils symmetric, round, reactive to light  Neck: -  No gross swelling, no palpable masses -  Thyroid midline, not enlarged, mobile, no palpable masses  Cardiovascular: -  RRR, no murmurs -  No LE edema  Respiratory: -  Normal respiratory effort, no accessory muscle use, no retraction -  Breath sounds equal, no wheezes, no ronchi, no crackles  Musculoskeletal: -  No clubbing, no cyanosis -  Gait normal  Skin: -  No significant lesion on inspection -  Warm and dry to palpation   ASSESSMENT/PLAN: Hypokalemia - Plan: Basic Metabolic Panel (BMET)  Follow-up for resolved condition  Obesity (BMI 30-39.9)  Essential hypertension  Patient instructed to sign release of records form from his previous PCP and nephrologist to check labs. Need to recheck low K from ER. Will call in supplement (I didn't see anything rx'd from ED) if still low and recheck in 1 week.  Patient should return in 6 mo to recheck HTN or prn. The patient voiced understanding and agreement to the plan.   Jilda Rocheicholas Paul StarksWendling, DO 02/29/16  3:52 PM

## 2016-03-01 LAB — BASIC METABOLIC PANEL
BUN: 15 mg/dL (ref 6–23)
CALCIUM: 9.4 mg/dL (ref 8.4–10.5)
CHLORIDE: 101 meq/L (ref 96–112)
CO2: 32 meq/L (ref 19–32)
Creatinine, Ser: 1.68 mg/dL — ABNORMAL HIGH (ref 0.40–1.50)
GFR: 57.3 mL/min — ABNORMAL LOW (ref >60.00)
Glucose, Bld: 88 mg/dL (ref 70–99)
Potassium: 3.7 mEq/L (ref 3.5–5.1)
SODIUM: 140 meq/L (ref 135–145)

## 2016-04-29 ENCOUNTER — Ambulatory Visit (INDEPENDENT_AMBULATORY_CARE_PROVIDER_SITE_OTHER): Payer: BLUE CROSS/BLUE SHIELD

## 2016-04-29 ENCOUNTER — Ambulatory Visit (INDEPENDENT_AMBULATORY_CARE_PROVIDER_SITE_OTHER): Payer: BLUE CROSS/BLUE SHIELD | Admitting: Podiatry

## 2016-04-29 DIAGNOSIS — M79673 Pain in unspecified foot: Secondary | ICD-10-CM

## 2016-04-29 DIAGNOSIS — B351 Tinea unguium: Secondary | ICD-10-CM | POA: Diagnosis not present

## 2016-04-29 DIAGNOSIS — M722 Plantar fascial fibromatosis: Secondary | ICD-10-CM | POA: Diagnosis not present

## 2016-04-29 DIAGNOSIS — M79672 Pain in left foot: Secondary | ICD-10-CM

## 2016-04-29 DIAGNOSIS — M79671 Pain in right foot: Secondary | ICD-10-CM

## 2016-04-29 DIAGNOSIS — R52 Pain, unspecified: Secondary | ICD-10-CM

## 2016-04-29 MED ORDER — NONFORMULARY OR COMPOUNDED ITEM: 2 refills | Status: DC

## 2016-05-01 NOTE — Progress Notes (Signed)
Subjective:     Patient ID: Steven SmilesKeith E Awe, male   DOB: 09/16/1970, 45 y.o.   MRN: [id number]  HPI 45 year old male presents presents the office today for concerns of pain to the arches of both of his feet which is been ongoing for several years. He states he had orthotics previous which helped however he does not wear them and they're worn out do not fit. He denies any swelling or redness. No numbness or tingling. No recent treatment. The pain does not wake him up at night only pain is with prolonged walking or activity. He has discoloration to his hallux toenail but no pain redness. He has tried OTC treatment. No other complaints at this time.   Review of Systems  All other systems reviewed and are negative.      Objective:   Physical Exam General: AAO x3, NAD  Dermatological: hallux toenails dystrophic, discolored. There is no edema, erythema or signs of infection There are no open sores, no preulcerative lesions, no rash or signs of infection present.  Vascular: Dorsalis Pedis artery and Posterior Tibial artery pedal pulses are 2/4 bilateral with immedate capillary fill time.  There is no pain with calf compression, swelling, warmth, erythema.   Neruologic: Grossly intact via light touch bilateral. Vibratory intact via tuning fork bilateral. Protective threshold with Semmes Wienstein monofilament intact to all pedal sites bilateral.   Musculoskeletal: There is a decrease in medial arch height. There is no areas of tenderness to palpation at this time. There is subjective tenderness to palpation on the medial band of the plantar fascia within the arch of the foot. There is no overlying edema or erythema. Muscular strength 5/5 in all groups tested bilateral. ROM WNL.   Gait: Unassisted, Nonantalgic.      Assessment:     Symptomatic flatfeet; plantar fasciitis; onychomycosis     Plan:     -Treatment options discussed including all alternatives, risks, and complications -Etiology of symptoms  were discussed -X-rays were obtained and reviewed with the patient. No evidence of acute fracture. Flatfoot is present. -Discussed with him a new CMO and he wishes to proceed with this. He was [facility] for [facility]  -Discussed shoe gear modifications as well -Stretching -Ice prn -Compound cream for onychomycosis  -Follow-up in 3 weeks to [facility] or sooner if any problems arise. In the meantime, encouraged to call the office with any questions, concerns, change in symptoms.   Ovid CurdMatthew Bryana Froemming, DPM

## 2016-05-24 ENCOUNTER — Encounter: Payer: Self-pay | Admitting: Podiatry

## 2016-05-24 ENCOUNTER — Ambulatory Visit (INDEPENDENT_AMBULATORY_CARE_PROVIDER_SITE_OTHER): Payer: BLUE CROSS/BLUE SHIELD | Admitting: Podiatry

## 2016-05-24 DIAGNOSIS — M722 Plantar fascial fibromatosis: Secondary | ICD-10-CM

## 2016-05-24 DIAGNOSIS — M79673 Pain in unspecified foot: Secondary | ICD-10-CM

## 2016-05-24 NOTE — Patient Instructions (Signed)

## 2016-05-24 NOTE — Progress Notes (Signed)
Patient presents to [facility]. He was seen by Hadley PenLisa Cox, CMA and orthotics were dispensed. Oral and written break-in instructions discussed. He declined an appointment by myself. Follow-up in 4 weeks.

## 2017-06-21 IMAGING — CT CT RENAL STONE PROTOCOL
2 of 4 series · 15 of 46 positions shown, 17 images · non-contrast
Comparison: Ultrasound abdomen 02/28/2011

CLINICAL DATA: Patient with right flank pain and burning sensation
since 02/14/2016. History of urinary tract infection.

EXAM:
CT ABDOMEN AND PELVIS WITHOUT CONTRAST
TECHNIQUE: Multidetector CT imaging of the abdomen and pelvis was performed
following the standard protocol without IV contrast.

[Series 2: axial st · axial · 0.93mm/px · z∈[-483,-33]mm · 12 of 100 slices shown, 14 images]
[im 5/100  soft-tissue]
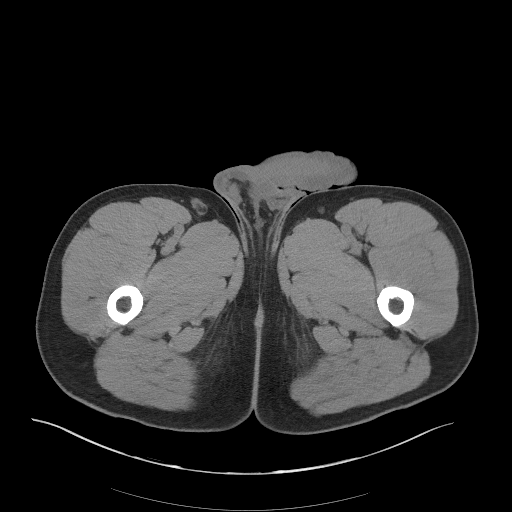
[im 5/100  bone]
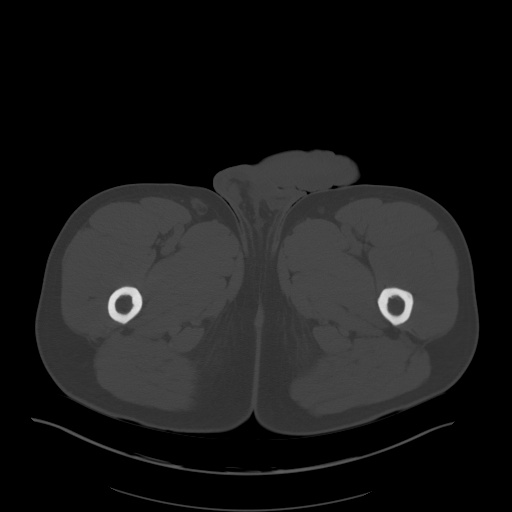
[im 13/100  soft-tissue]
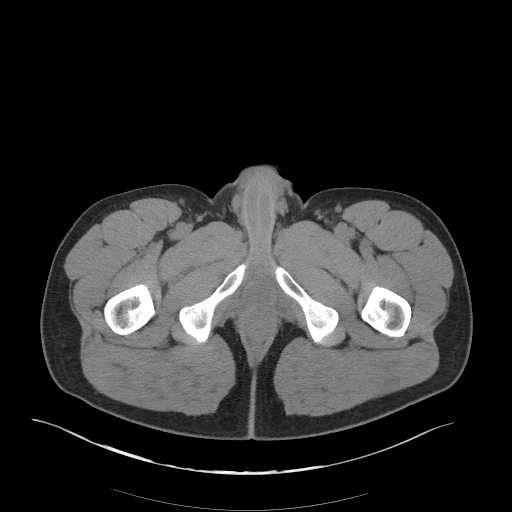
[im 21/100  soft-tissue]
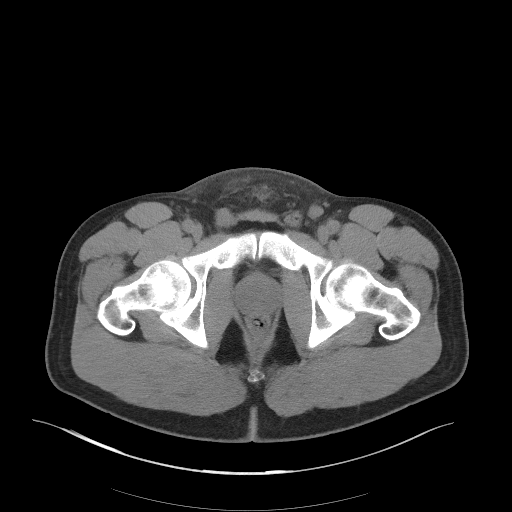
[im 29/100  soft-tissue]
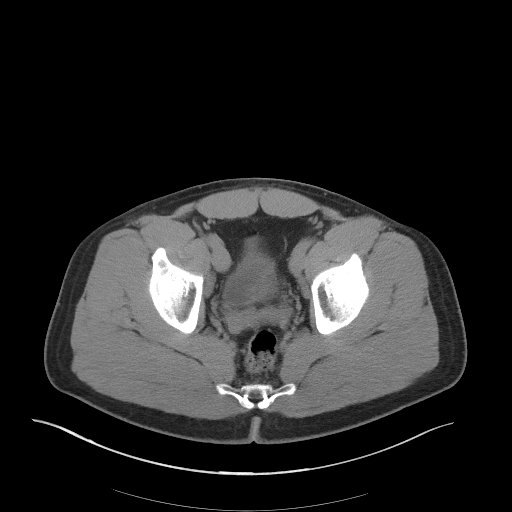
[im 38/100  soft-tissue]
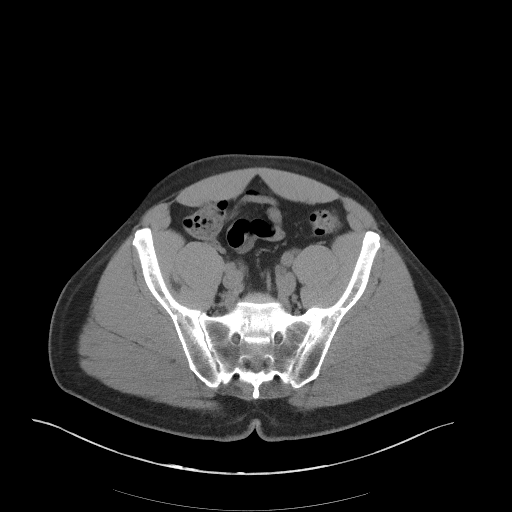
[im 46/100  soft-tissue]
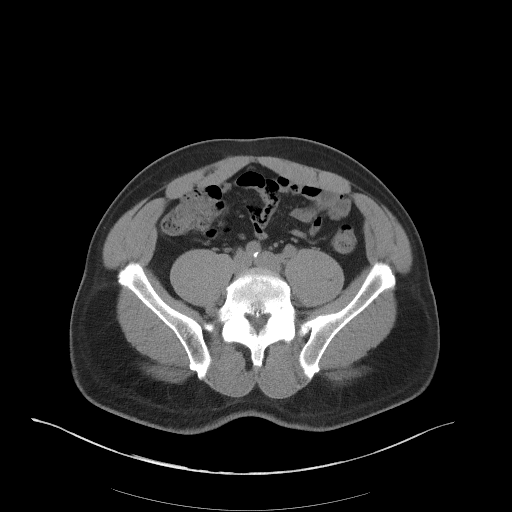
[im 54/100  soft-tissue]
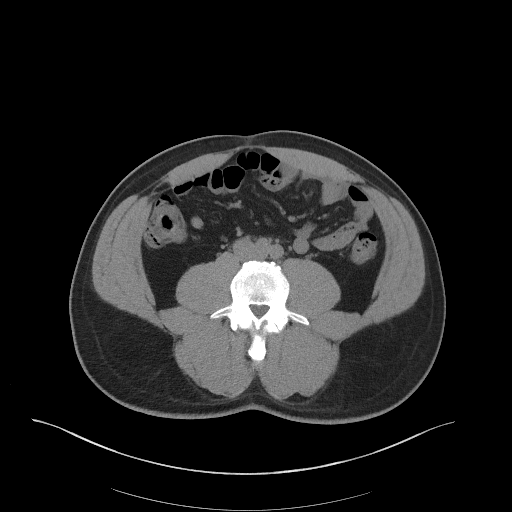
[im 62/100  soft-tissue]
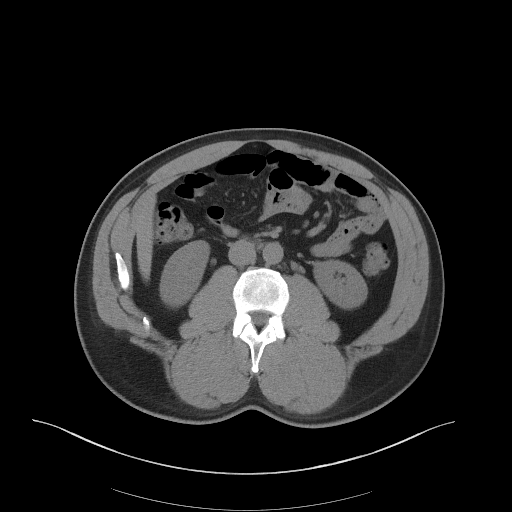
[im 71/100  soft-tissue]
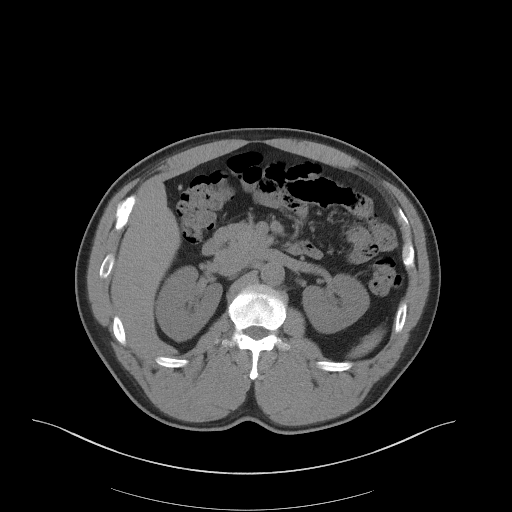
[im 71/100  bone]
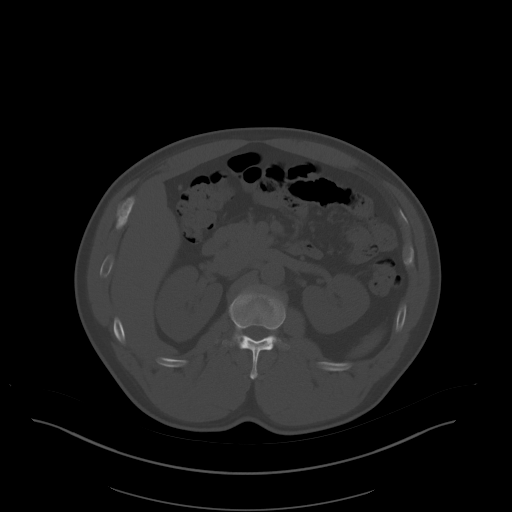
[im 79/100  soft-tissue]
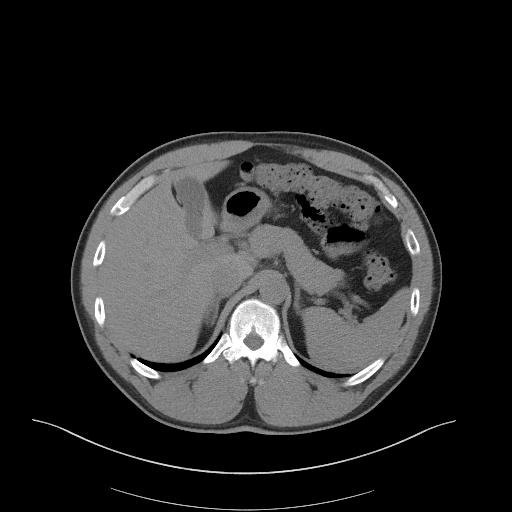
[im 87/100  soft-tissue]
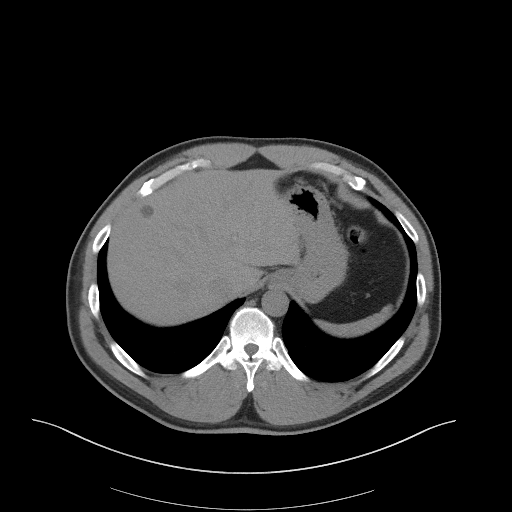
[im 95/100  soft-tissue]
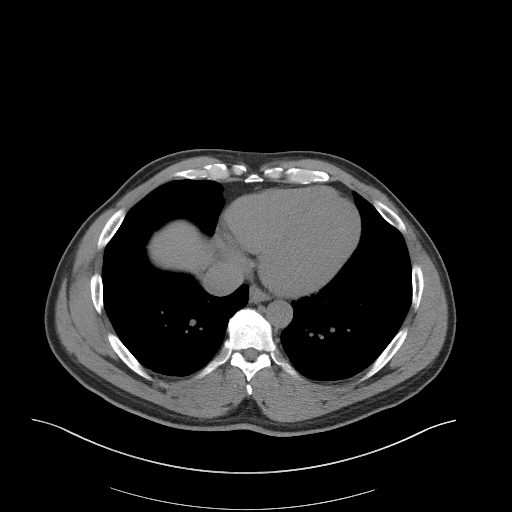

[Series 4: coronal st · coronal · 0.77mm/px · 3 of 104 slices shown]
[im 35/104  soft-tissue]
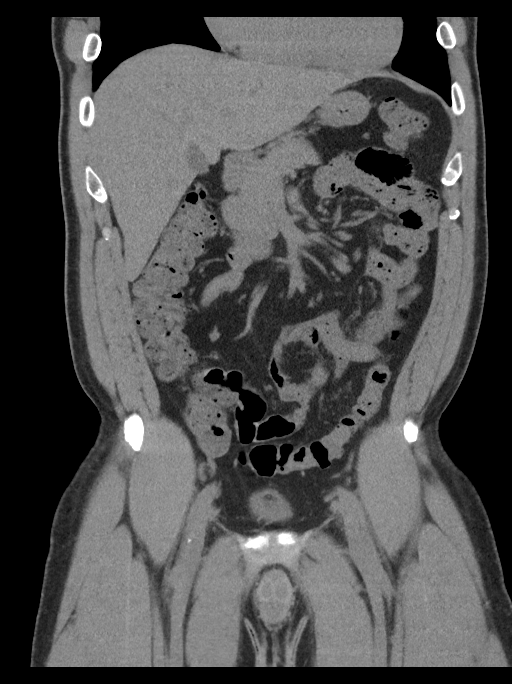
[im 46/104  soft-tissue]
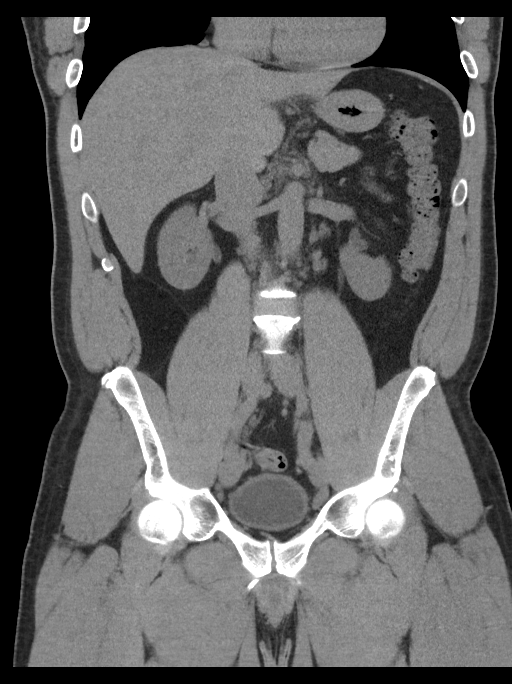
[im 58/104  soft-tissue]
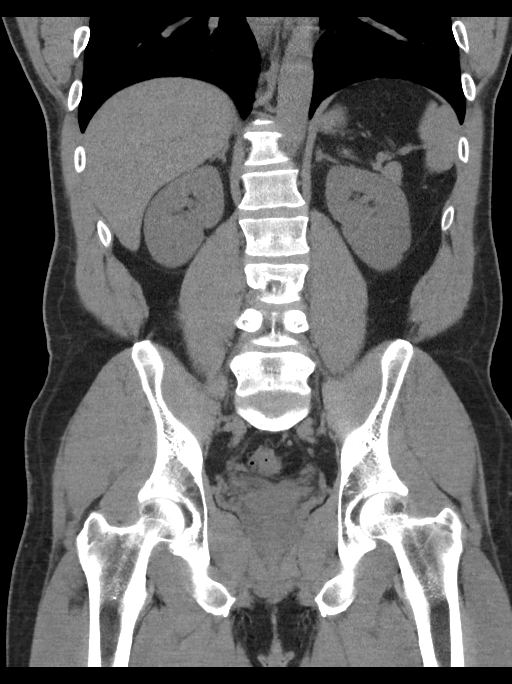

[15 of 46 positions shown; findings below may reference images not displayed]

FINDINGS: Lower chest: Normal heart size. Minimal atelectasis within the lung
bases. No pleural effusion.

Hepatobiliary: Liver is normal in size and contour. Within the
hepatic dome there is a 1.3 cm cyst. Too small to characterize 3 mm
low-attenuation lesion left hepatic lobe (image 17; series 2).
Gallbladder is unremarkable.

Pancreas: Unremarkable

Spleen: Unremarkable

Adrenals/Urinary Tract: Normal adrenal glands. Kidneys are symmetric
in size. No nephroureterolithiasis. No hydronephrosis. Urinary
bladder is unremarkable. Mild wall thickening of the urinary
bladder.

Stomach/Bowel: Sigmoid colonic diverticulosis. No CT evidence for
acute diverticulitis. Normal morphology of the stomach. No evidence
for bowel obstruction. No free fluid or free intraperitoneal air.

Vascular/Lymphatic: Peripheral calcified atherosclerotic plaque
involving the abdominal aorta. No retroperitoneal lymphadenopathy.
Prominent bilateral inguinal lymph nodes measuring 14 mm on the
right (image 82; series 2) and 14 mm on the left (image 80; series
2).

Reproductive: Prostate is unremarkable.

Other: There is subcutaneous fat stranding along the lower anterior
abdominal wall just superior to the base of the penis (image 83;
series 2).

Musculoskeletal: Lumbar spine degenerative changes. No aggressive or
acute appearing osseous lesions.
IMPRESSION: Nonspecific subcutaneous fat stranding within the lower anterior
abdominal wall just superior to the base of the penis, concerning
for the possibility of cellulitis or other acute infectious process.

There are prominent and mildly enlarged bilateral inguinal lymph
nodes which are nonspecific however likely reactive in etiology.

No nephroureterolithiasis.  No hydronephrosis.

Aortic atherosclerosis.

## 2018-01-29 ENCOUNTER — Ambulatory Visit (INDEPENDENT_AMBULATORY_CARE_PROVIDER_SITE_OTHER): Payer: BLUE CROSS/BLUE SHIELD | Admitting: Orthopaedic Surgery

## 2018-01-29 ENCOUNTER — Encounter (INDEPENDENT_AMBULATORY_CARE_PROVIDER_SITE_OTHER): Payer: Self-pay | Admitting: Orthopaedic Surgery

## 2018-01-29 ENCOUNTER — Ambulatory Visit (INDEPENDENT_AMBULATORY_CARE_PROVIDER_SITE_OTHER): Payer: Self-pay

## 2018-01-29 DIAGNOSIS — M25571 Pain in right ankle and joints of right foot: Secondary | ICD-10-CM | POA: Diagnosis not present

## 2018-01-29 DIAGNOSIS — M2022 Hallux rigidus, left foot: Secondary | ICD-10-CM

## 2018-01-29 HISTORY — DX: Hallux rigidus, left foot: M20.22

## 2018-01-29 NOTE — Progress Notes (Signed)
Office Visit Note   Patient: Steven Roth           Date of Birth: 10-11-1970           MRN: [id number] Visit Date: 01/29/2018              Requested by: Sharlene Dory, DO [address] [place], [place] [address] PCP: Sharlene Dory, DO   Assessment & Plan: Visit Diagnoses:  1. Pain in right ankle and joints of right foot   2. Hallux rigidus, left foot     Plan: I talked him about his great toe MTP joint arthritis and treatment options.  He understands that he is aware much more firm shoe with less cushioning at that MTP joint allowed to be more rigid and that would certainly help.  He will avoid sports that involve pushing off as well.  I did give him some samples of the topical anti-inflammatory to try.  He understands that he gets bad enough my next step would be to try a steroid injection in this point and he is agreeable to that.  All questions were answered and addressed.  Follow-up will otherwise be as needed.  Follow-Up Instructions: Return if symptoms worsen or fail to improve.   Orders:  Orders Placed This Encounter  Procedures  . XR Foot Complete Left   No orders of the defined types were placed in this encounter.     Procedures: No procedures performed   Clinical Data: No additional findings.   Subjective: Chief Complaint  Patient presents with  . Right Foot - Pain  The patient's and also for the first time but was seen him with family before.  He comes in with chief complaint of great toe pain of his left great toe with no known injury.  He says it hurts when he starts stepping certain ways and pushing off with his foot.  He does point to the MTP joint as a source of his pain.  He is not a diabetic.  He does not have a history of gout.  He is very athletic growing up.  Is a very active 47 years old.  He denies any numbness and tingling or swelling.  HPI  Review of Systems He currently denies any headache, chest pain,  shortness of breath, fever, chills, nausea, vomiting.  Objective: Vital Signs: There were no vitals taken for this visit.  Physical Exam He is alert and oriented x3 and in no acute distress Ortho Exam Examination of his left foot shows normal foot contours.  He is neurovascularly intact with a well perfused foot with strong palpable pulses.  His has normal sensation as well.  His arch appears normal.  He has full range of motion of his first MTP joint but he can palpate irregularities around the joint itself consistent with bone spurring.  The extremes of flexion extension does cause pain but it is only mild. Specialty Comments:  No specialty comments available.  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  There is slight joint space narrowing with para-articular osteophytes.    PMFS History: Patient Active Problem List   Diagnosis Date Noted  . Hallux rigidus, left foot 01/29/2018  . Renal insufficiency 02/27/2011  . Mild hyperlipidemia 08/29/2010  . TINEA BARBAE 05/31/2010  . OBSTRUCTIVE SLEEP APNEA 07/14/2008  . Essential hypertension 06/02/2008  . SNORING, HX  OF 06/02/2008   Past Medical History:  Diagnosis Date  . Allergy   . History of chicken pox   . Hypertension     Family History  Problem Relation Age of Onset  . Hypertension Mother   . Diabetes Mother   . Hypertension Unknown        family history  . Heart attack Unknown        maternal uncle in 83's  . Heart disease Maternal Uncle     Past Surgical History:  Procedure Laterality Date  . NASAL SINUS SURGERY     1996  . WISDOM TOOTH EXTRACTION     Social History   Occupational History  . Not on file  Tobacco Use  . Smoking status: Never Smoker  . Smokeless tobacco: Never Used  Substance and Sexual Activity  . Alcohol use: No  . Drug use: No  . Sexual activity: Not on file

## 2019-09-15 ENCOUNTER — Other Ambulatory Visit: Payer: Self-pay | Admitting: Orthopedic Surgery

## 2019-09-15 DIAGNOSIS — M25512 Pain in left shoulder: Secondary | ICD-10-CM

## 2019-11-01 ENCOUNTER — Telehealth: Payer: Self-pay

## 2019-11-01 NOTE — Telephone Encounter (Signed)
NOTES ON FILE FROM [facility] [phone] SENT REFERRAL TO SCHEDULING

## 2019-11-03 ENCOUNTER — Telehealth: Payer: Self-pay

## 2019-11-03 NOTE — Telephone Encounter (Signed)
NOTES ON FILE FROM [facility] [phone], SENT REFERRAL TO SCHEDULING 

## 2019-11-03 NOTE — Telephone Encounter (Signed)
NOTES ON FILE FROM [facility] [phone], SENT REFERRAL TO SCHEDULING

## 2019-11-05 ENCOUNTER — Telehealth: Payer: Self-pay

## 2019-11-05 NOTE — Telephone Encounter (Signed)
NOTES TAKEN TO MEDICAL RECORDS TO BE SCAN INTO CHART

## 2019-11-12 ENCOUNTER — Other Ambulatory Visit: Payer: Self-pay

## 2019-11-12 ENCOUNTER — Telehealth: Payer: Self-pay | Admitting: *Deleted

## 2019-11-12 ENCOUNTER — Telehealth: Payer: Self-pay

## 2019-11-12 ENCOUNTER — Encounter: Payer: Self-pay | Admitting: Cardiology

## 2019-11-12 ENCOUNTER — Telehealth (INDEPENDENT_AMBULATORY_CARE_PROVIDER_SITE_OTHER): Payer: BC Managed Care – PPO | Admitting: Cardiology

## 2019-11-12 VITALS — Ht 72.0 in | Wt 245.0 lb

## 2019-11-12 DIAGNOSIS — G4733 Obstructive sleep apnea (adult) (pediatric): Secondary | ICD-10-CM

## 2019-11-12 DIAGNOSIS — I10 Essential (primary) hypertension: Secondary | ICD-10-CM | POA: Diagnosis not present

## 2019-11-12 NOTE — Patient Instructions (Signed)
Medication Instructions:  .isntcurr  *If you need a refill on your cardiac medications before your next appointment, please call your pharmacy*  Testing/Procedures: Your physician has recommended that you have a sleep study. This test records several body functions during sleep, including: brain activity, eye movement, oxygen and carbon dioxide blood levels, heart rate and rhythm, breathing rate and rhythm, the flow of air through your mouth and nose, snoring, body muscle movements, and chest and belly movement.  Follow-Up: At [facility], you and your health needs are our priority.  As part of our continuing mission to provide you with exceptional heart care, we have created designated Provider Care Teams.  These Care Teams include your primary Cardiologist (physician) and Advanced Practice Providers (APPs -  Physician Assistants and Nurse Practitioners) who all work together to provide you with the care you need, when you need it.  Follow up with Dr. Mayford Knife as needed based on results from testing.

## 2019-11-12 NOTE — Addendum Note (Signed)
Addended by: Theresia Majors on: 11/12/2019 08:32 AM   Modules accepted: Orders

## 2019-11-12 NOTE — Telephone Encounter (Signed)
  Patient Consent for Virtual Visit         Steven Roth has provided verbal consent on 11/12/2019 for a virtual visit (video or telephone).   CONSENT FOR VIRTUAL VISIT FOR:  Steven Roth  By participating in this virtual visit I agree to the following:  I hereby voluntarily request, consent and authorize [facility] and its employed or contracted physicians, physician assistants, nurse practitioners or other licensed health care professionals (the Practitioner), to provide me with telemedicine health care services (the "Services") as deemed necessary by the treating Practitioner. I acknowledge and consent to receive the Services by the Practitioner via telemedicine. I understand that the telemedicine visit will involve communicating with the Practitioner through live audiovisual communication technology and the disclosure of certain medical information by electronic transmission. I acknowledge that I have been given the opportunity to request an in-person assessment or other available alternative prior to the telemedicine visit and am voluntarily participating in the telemedicine visit.  I understand that I have the right to withhold or withdraw my consent to the use of telemedicine in the course of my care at any time, without affecting my right to future care or treatment, and that the Practitioner or I may terminate the telemedicine visit at any time. I understand that I have the right to inspect all information obtained and/or recorded in the course of the telemedicine visit and may receive copies of available information for a reasonable fee.  I understand that some of the potential risks of receiving the Services via telemedicine include:  Marland Kitchen Delay or interruption in medical evaluation due to technological equipment failure or disruption; . Information transmitted may not be sufficient (e.g. poor resolution of images) to allow for appropriate medical decision making by the Practitioner;  and/or  . In rare instances, security protocols could fail, causing a breach of personal health information.  Furthermore, I acknowledge that it is my responsibility to provide information about my medical history, conditions and care that is complete and accurate to the best of my ability. I acknowledge that Practitioner's advice, recommendations, and/or decision may be based on factors not within their control, such as incomplete or inaccurate data provided by me or distortions of diagnostic images or specimens that may result from electronic transmissions. I understand that the practice of medicine is not an exact science and that Practitioner makes no warranties or guarantees regarding treatment outcomes. I acknowledge that a copy of this consent can be made available to me via my patient portal [facility] MyChart), or I can request a printed copy by calling the office of [facility].    I understand that my insurance will be billed for this visit.   I have read or had this consent read to me. . I understand the contents of this consent, which adequately explains the benefits and risks of the Services being provided via telemedicine.  . I have been provided ample opportunity to ask questions regarding this consent and the Services and have had my questions answered to my satisfaction. . I give my informed consent for the services to be provided through the use of telemedicine in my medical care

## 2019-11-12 NOTE — Telephone Encounter (Signed)
-----   Message from Theresia Majors, RN sent at 11/12/2019  8:32 AM EDT ----- Split night sleep study has been ordered. Thanks!

## 2019-11-12 NOTE — Progress Notes (Signed)
Virtual Sleep Medicine Consult  Note   This visit type was conducted due to national recommendations for restrictions regarding the COVID-19 Pandemic (e.g. social distancing) in an effort to limit this patient's exposure and mitigate transmission in our community.  Due to his co-morbid illnesses, this patient is at least at moderate risk for complications without adequate follow up.  This format is felt to be most appropriate for this patient at this time.  All issues noted in this document were discussed and addressed.  A limited physical exam was performed with this format.  Please refer to the patient's chart for his consent to telehealth for [facility]   Evaluation Performed: Sleep Medicine Consult  This visit type was conducted due to national recommendations for restrictions regarding the COVID-19 Pandemic (e.g. social distancing).  This format is felt to be most appropriate for this patient at this time.  All issues noted in this document were discussed and addressed.  No physical exam was performed (except for noted visual exam findings with Video Visits).  Please refer to the patient's chart (MyChart message for video visits and phone note for telephone visits) for the patient's consent to telehealth for [facility].  Date:  11/12/2019   ID:  Steven Roth, DOB [birth year], MRN [id number]  Patient Location:  Home  Provider location:   [facility]  PCP:  No primary care provider on file.  Cardiologist:  No primary care provider on file. Electrophysiologist:  None   Chief Complaint:  OSA  History of Present Illness:    Steven Roth is a 49 y.o. male who presents via audio/video conferencing for a telehealth visit today.    This is a 49yo male with a hx of mild OSA with an RDI of 8/hr and O2 desats to 89%.  He was seen by Dr. 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  He did not see Dr. Shelle Iron after that and recent was seen by Dr. Annalee Genta with ENT for ongoing heavy snoring.  He is now referred by Dr. Annalee Genta for formal PSG study to determine if OSA has progressed.    He tells me that he has gained weight and he has been having problems with excessive daytime sleepiness.  He goes to bed around 12MN-1am and gets up around 6am.  He does not wake himself up snoring or gasping for breath but his wife has noticed that he stops breathing in his sleep.  When he gets home from work he will sit down and fall asleep.    Prior CV studies:   The following studies were reviewed today:  Sleep study 2018  Past Medical History:  Diagnosis Date  . Allergy   . Hallux rigidus, left foot 01/29/2018  . History of chicken pox   . Hypertension   . Mild hyperlipidemia 08/29/2010  . OBSTRUCTIVE SLEEP APNEA 07/14/2008   Qualifier: Diagnosis of  By: Shelle Iron MD, Maree Krabbe   . Renal insufficiency 02/27/2011  . SNORING, HX OF 06/02/2008   Qualifier: Diagnosis of  By: Janit Bern    . TINEA BARBAE 05/31/2010   Qualifier: Diagnosis of  By: Nena Jordan    Past Surgical History:  Procedure Laterality Date  . NASAL SINUS SURGERY     1996  . WISDOM TOOTH EXTRACTION       Current  Meds  Medication Sig  . amLODipine (NORVASC) 10 MG tablet Take 1 tablet (10 mg total) by mouth daily.  . valsartan-hydrochlorothiazide (DIOVAN HCT) 160-25 MG per tablet Take 1 tablet by mouth daily.     Allergies:   Patient has no known allergies.   Social History   Tobacco Use  . Smoking status: Never Smoker  . Smokeless tobacco: Never Used  Substance Use Topics  . Alcohol use: No  . Drug use: No     Family Hx: The patient's family history includes Diabetes in his mother; Heart attack in his unknown relative; Heart disease in his maternal uncle; Hypertension in his mother and unknown relative.  ROS:   Please see the history of present  illness.     All other systems reviewed and are negative.   Labs/Other Tests and Data Reviewed:    Recent Labs: No results found for requested labs within last 8760 hours.   Recent Lipid Panel Lab Results  Component Value Date/Time   CHOL 210 (H) 02/25/2011 05:14 PM   TRIG 98.0 02/25/2011 05:14 PM   HDL 50.50 02/25/2011 05:14 PM   CHOLHDL 4 02/25/2011 05:14 PM   LDLCALC 112 (H) 06/28/2010 12:52 AM   LDLDIRECT 148.7 02/25/2011 05:14 PM    Wt Readings from Last 3 Encounters:  11/12/19 245 lb (111.1 kg)  02/29/16 238 lb (108 kg)  02/19/16 239 lb (108.4 kg)     Objective:    Vital Signs:  Ht 6' (1.829 m)   Wt 245 lb (111.1 kg)   BMI 33.23 kg/m     ASSESSMENT & PLAN:    1.  OSA -he has a hx of minimal OSA on sleep study in 2018 -he has tried treatment for nasal airway obstruction with nasal steroids under care of ENT but still has very heavy snoring -he clearly has excessive daytime sleepiness and likely has significant sleep apnea given his severe snoring and awakening gasping for breath -will get a split night sleep study to evaluated for degree of OSA  2.  HTN -continue Valsartan-HCT 160-25mg  daily and amlodipine 10mg  daily  3.  Obesity -I have encouraged him to get into a routine exercise program and cut back on carbs and portions.  -refer to healthy weight and wellness program   Time:   Today, I have spent 20 minutes on telemedicine discussing medical problems including OSA and HTN and reviewing patient's chart including sleep study 2018.  Medication Adjustments/Labs and Tests Ordered: Current medicines are reviewed at length with the patient today.  Concerns regarding medicines are outlined above.  Tests Ordered: No orders of the defined types were placed in this encounter.  Medication Changes: No orders of the defined types were placed in this encounter.   Disposition:  Follow up prn  Signed, Fransico Him, MD  11/12/2019 8:18 AM    [place]

## 2019-11-25 ENCOUNTER — Telehealth: Payer: Self-pay | Admitting: Cardiology

## 2019-11-25 NOTE — Telephone Encounter (Signed)
Reached out to the wife and informed her the study was sent to insurance and we are awaitng the prior auth to come back. Wife is agreeable to treatment.

## 2019-11-25 NOTE — Telephone Encounter (Signed)
Wife of the patient called and wanted to know when her Husband would be scheduled for his sleep study.

## 2019-12-24 ENCOUNTER — Telehealth: Payer: Self-pay | Admitting: Cardiology

## 2019-12-24 NOTE — Telephone Encounter (Signed)
Carollee Massed, CMA; Reesa Chew, CMA [facility] order #[id number] for split night - valid 12-24-19 thru 06-21-20

## 2019-12-24 NOTE — Telephone Encounter (Signed)
[facility] order #[id number] for split night - valid 12-24-19 thru 06-21-20

## 2019-12-27 ENCOUNTER — Telehealth: Payer: Self-pay | Admitting: *Deleted

## 2019-12-27 NOTE — Telephone Encounter (Signed)
Patient is scheduled for lab study on 01/23/20. Patient understands his sleep study will be done at [facility] sleep lab. Patient understands he will receive a sleep packet in a week or so. Patient understands to call if he does not receive the sleep packet in a timely manner. Patient agrees with treatment and thanked me for call.

## 2019-12-27 NOTE — Telephone Encounter (Signed)
Staff message sent to Coralee North ok to schedule sleep study. See note from [facility].

## 2019-12-29 NOTE — Telephone Encounter (Addendum)
On 8/2 spoke to patients wife and both are agreeable and aware to lab study on 8/29 per my 11/12/19 telephone encounter.

## 2020-01-23 ENCOUNTER — Other Ambulatory Visit: Payer: Self-pay

## 2020-01-23 ENCOUNTER — Ambulatory Visit (HOSPITAL_BASED_OUTPATIENT_CLINIC_OR_DEPARTMENT_OTHER): Payer: BC Managed Care – PPO | Attending: Cardiology | Admitting: Cardiology

## 2020-01-23 DIAGNOSIS — R0683 Snoring: Secondary | ICD-10-CM | POA: Insufficient documentation

## 2020-01-23 DIAGNOSIS — G4733 Obstructive sleep apnea (adult) (pediatric): Secondary | ICD-10-CM

## 2020-01-23 DIAGNOSIS — I10 Essential (primary) hypertension: Secondary | ICD-10-CM | POA: Diagnosis not present

## 2020-01-24 NOTE — Procedures (Signed)
   Patient Name: Steven Roth, Steven Roth Date: 01/23/2020 Gender: Male D.O.B: [birth year] Age (years): 31 Referring Provider: Armanda Magic MD, ABSM Height (inches): 72 Interpreting Physician: Armanda Magic MD, ABSM Weight (lbs): 245 RPSGT: Rosette Reveal BMI: 33 MRN: [id number] Neck Size: 18.00  CLINICAL INFORMATION Sleep Study Type: NPSG  Indication for sleep study: Hypertension  Epworth Sleepiness Score: 4  SLEEP STUDY TECHNIQUE As per the AASM Manual for the Scoring of Sleep and Associated Events v2.3 (April 2016) with a hypopnea requiring 4% desaturations.  The channels recorded and monitored were frontal, central and occipital EEG, electrooculogram (EOG), submentalis EMG (chin), nasal and oral airflow, thoracic and abdominal wall motion, anterior tibialis EMG, snore microphone, electrocardiogram, and pulse oximetry.  MEDICATIONS Medications self-administered by patient taken the night of the study : N/A  SLEEP ARCHITECTURE The study was initiated at 10:50:19 PM and ended at 4:56:22 AM.  Sleep onset time was 22.4 minutes and the sleep efficiency was 75.0%. The total sleep time was 274.5 minutes.  Stage REM latency was 92.0 minutes.  The patient spent 10.6% of the night in stage N1 sleep, 71.4% in stage N2 sleep, 0.0% in stage N3 and 18% in REM.  Alpha intrusion was absent.  Supine sleep was 32.42%.  RESPIRATORY PARAMETERS The overall apnea/hypopnea index (AHI) was 3.5 per hour. There were 8 total apneas, including 8 obstructive, 0 central and 0 mixed apneas. There were 8 hypopneas and 10 RERAs.  The AHI during Stage REM sleep was 12.1 per hour.  AHI while supine was 8.8 per hour.  The mean oxygen saturation was 94.8%. The minimum SpO2 during sleep was 86.0%.  loud snoring was noted during this study.  CARDIAC DATA The 2 lead EKG demonstrated sinus rhythm. The mean heart rate was 56.5 beats per minute. Other EKG findings include: None.  LEG MOVEMENT DATA The  total PLMS were 0 with a resulting PLMS index of 0.0. Associated arousal with leg movement index was 0.0 .  IMPRESSIONS - No significant obstructive sleep apnea occurred during this study (AHI = 3.5/h). - No significant central sleep apnea occurred during this study (CAI = 0.0/h). - Mild oxygen desaturation was noted during this study (Min O2 = 86.0%). - The patient snored with loud snoring volume. - No cardiac abnormalities were noted during this study. - Clinically significant periodic limb movements did not occur during sleep. No significant associated arousals.  DIAGNOSIS - Normal Study  RECOMMENDATIONS - Avoid alcohol, sedatives and other CNS depressants that may worsen sleep apnea and disrupt normal sleep architecture. - Sleep hygiene should be reviewed to assess factors that may improve sleep quality. - Weight management and regular exercise should be initiated or continued if appropriate.  [Electronically signed] 01/24/2020 08:33 AM  Armanda Magic MD, ABSM Diplomate, American Board of Sleep Medicine

## 2020-01-27 ENCOUNTER — Telehealth: Payer: Self-pay | Admitting: *Deleted

## 2020-01-27 NOTE — Telephone Encounter (Signed)
-----   Message from Quintella Reichert, MD sent at 01/24/2020  8:35 AM EDT ----- Please let patient know that sleep study showed no significant sleep apnea.

## 2020-01-27 NOTE — Telephone Encounter (Signed)
Informed patient of sleep study results and patient understanding was verbalized. Patient/wife understands his sleep study showed showed no significant sleep apnea.   Pt/wife is aware and agreeable to normal results.

## 2021-01-20 ENCOUNTER — Emergency Department (HOSPITAL_BASED_OUTPATIENT_CLINIC_OR_DEPARTMENT_OTHER)
Admission: EM | Admit: 2021-01-20 | Discharge: 2021-01-20 | Disposition: A | Discharge status: Home / Self Care | Payer: BC Managed Care – PPO | Attending: Emergency Medicine | Admitting: Emergency Medicine

## 2021-01-20 ENCOUNTER — Other Ambulatory Visit: Payer: Self-pay

## 2021-01-20 ENCOUNTER — Encounter (HOSPITAL_BASED_OUTPATIENT_CLINIC_OR_DEPARTMENT_OTHER): Payer: Self-pay | Admitting: Emergency Medicine

## 2021-01-20 DIAGNOSIS — I10 Essential (primary) hypertension: Secondary | ICD-10-CM | POA: Diagnosis not present

## 2021-01-20 DIAGNOSIS — X509XXA Other and unspecified overexertion or strenuous movements or postures, initial encounter: Secondary | ICD-10-CM | POA: Insufficient documentation

## 2021-01-20 DIAGNOSIS — Y9389 Activity, other specified: Secondary | ICD-10-CM | POA: Insufficient documentation

## 2021-01-20 DIAGNOSIS — Z79899 Other long term (current) drug therapy: Secondary | ICD-10-CM | POA: Insufficient documentation

## 2021-01-20 DIAGNOSIS — Y990 Civilian activity done for income or pay: Secondary | ICD-10-CM | POA: Insufficient documentation

## 2021-01-20 DIAGNOSIS — S79921A Unspecified injury of right thigh, initial encounter: Secondary | ICD-10-CM | POA: Insufficient documentation

## 2021-01-20 DIAGNOSIS — S3991XA Unspecified injury of abdomen, initial encounter: Secondary | ICD-10-CM

## 2021-01-20 NOTE — Discharge Instructions (Addendum)
Avoid movements and activities that worsen the pain.  Continue to treat with Naprosyn.  You can try icing, as well as gentle stretching (within the tolerance of your pain).  There is a number below to call for a sports medicine follow-up.

## 2021-01-20 NOTE — ED Triage Notes (Signed)
Pt c/o right groin pain onset Friday after lunging. Pt denies urinary symptoms, or swelling.

## 2021-01-20 NOTE — ED Provider Notes (Signed)
[facility] EMERGENCY DEPARTMENT Provider Note   CSN: [id number] Arrival date & time: 01/20/21  1019     History Chief Complaint  Patient presents with   Groin Pain    Steven Roth is a 50 y.o. male.   Groin Pain Pertinent negatives include no chest pain, no abdominal pain and no shortness of breath. Patient presents for right-sided groin pain.  Injury occurred yesterday while at work.  He states that he was lunging toward a Radio broadcast assistant.  He planted his right foot and felt a pop and immediate pain in the area of his right groin.  Since that time, he has had continued pain.  Pain is worsened with certain movements.  He has been able to ambulate.  He denies any urinary symptoms.  He denies any radiation of pain.  He has treated his pain at home with Naprosyn.  Last dose was just prior to arrival in the ED.  Patient denies any history of injury to this area.  He denies any other symptoms.     Past Medical History:  Diagnosis Date   Allergy    Hallux rigidus, left foot 01/29/2018   History of chicken pox    Hypertension    Mild hyperlipidemia 08/29/2010   OBSTRUCTIVE SLEEP APNEA 07/14/2008   Qualifier: Diagnosis of  By: Shelle Iron MD, Maree Krabbe    Renal insufficiency 02/27/2011   SNORING, HX OF 06/02/2008   Qualifier: Diagnosis of  By: Janit Bern     TINEA BARBAE 05/31/2010   Qualifier: Diagnosis of  By: Nena Jordan     Patient Active Problem List   Diagnosis Date Noted   Hallux rigidus, left foot 01/29/2018   Renal insufficiency 02/27/2011   TINEA BARBAE 05/31/2010   OBSTRUCTIVE SLEEP APNEA 07/14/2008   Essential hypertension 06/02/2008   SNORING, HX OF 06/02/2008    Past Surgical History:  Procedure Laterality Date   NASAL SINUS SURGERY     1996   WISDOM TOOTH EXTRACTION         Family History  Problem Relation Age of Onset   Hypertension Mother    Diabetes Mother    Hypertension Unknown        family history   Heart attack Unknown        maternal  uncle in 22's   Heart disease Maternal Uncle     Social History   Tobacco Use   Smoking status: Never   Smokeless tobacco: Never  Vaping Use   Vaping Use: Never used  Substance Use Topics   Alcohol use: No   Drug use: No    Home Medications Prior to Admission medications   Medication Sig Start Date End Date Taking? Authorizing Provider  amLODipine (NORVASC) 10 MG tablet Take 1 tablet (10 mg total) by mouth daily. 02/25/11   Yoo, Doe-Hyun R, DO  valsartan-hydrochlorothiazide (DIOVAN HCT) 160-25 MG per tablet Take 1 tablet by mouth daily. 02/25/11   Artist Pais, Doe-Hyun R, DO    Allergies    Patient has no known allergies.  Review of Systems   Review of Systems  Constitutional:  Negative for chills and fever.  HENT:  Negative for ear pain and sore throat.   Eyes:  Negative for pain and visual disturbance.  Respiratory:  Negative for cough and shortness of breath.   Cardiovascular:  Negative for chest pain and palpitations.  Gastrointestinal:  Negative for abdominal pain and vomiting.  Genitourinary:  Negative for dysuria, flank pain, hematuria, scrotal  swelling and testicular pain.  Musculoskeletal:  Positive for arthralgias. Negative for back pain, joint swelling, myalgias and neck pain.  Skin:  Negative for color change, rash and wound.  Neurological:  Negative for seizures and syncope.  All other systems reviewed and are negative.  Physical Exam Updated Vital Signs BP (!) 137/92   Pulse 64   Temp (P) 98.5 F (36.9 C) (Oral)   Resp 18   Ht 6' (1.829 m)   Wt 115.2 kg   SpO2 100%   BMI 34.45 kg/m   Physical Exam Vitals and nursing note reviewed.  Constitutional:      General: He is not in acute distress.    Appearance: Normal appearance. He is well-developed and normal weight. He is not ill-appearing, toxic-appearing or diaphoretic.  HENT:     Head: Normocephalic and atraumatic.     Right Ear: External ear normal.     Left Ear: External ear normal.     Nose: Nose  normal.  Eyes:     Conjunctiva/sclera: Conjunctivae normal.  Cardiovascular:     Rate and Rhythm: Normal rate and regular rhythm.     Heart sounds: No murmur heard. Pulmonary:     Effort: Pulmonary effort is normal. No respiratory distress.     Breath sounds: Normal breath sounds.  Abdominal:     Palpations: Abdomen is soft.     Tenderness: There is no abdominal tenderness.  Musculoskeletal:        General: Tenderness present. No swelling or deformity.     Cervical back: Neck supple.     Right lower leg: No edema.     Left lower leg: No edema.     Comments: Right hip flexor range of motion limited by pain.  Skin:    General: Skin is warm and dry.     Findings: No bruising.  Neurological:     General: No focal deficit present.     Mental Status: He is alert and oriented to person, place, and time.     Sensory: No sensory deficit.     Motor: No weakness.    ED Results / Procedures / Treatments   Labs (all labs ordered are listed, but only abnormal results are displayed) Labs Reviewed - No data to display  EKG None  Radiology No results found.  Procedures Procedures   Medications Ordered in ED Medications - No data to display  ED Course  I have reviewed the triage vital signs and the nursing notes.  Pertinent labs & imaging results that were available during my care of the patient were reviewed by me and considered in my medical decision making (see chart for details).    MDM Rules/Calculators/A&P                           Patient is a healthy 50 year old male presenting for groin injury.  Injury occurred yesterday.  Mechanism was a lunging forward and planting his right foot.  He subsequently had a pop and pain in the area of his right groin.  On exam, he does have tenderness to palpation in this area.  He also has pain in this area with hip flexion.  Pain limits his range of motion.  He is able to ambulate without significant discomfort.  Patient is currently  treating his injury with NSAIDs.  He took naproxen just before arrival.  Patient was advised to continue NSAID therapy.  Additionally, patient would benefit from sports  medicine follow-up to ensure good healing and guidance on rehabilitation from this injury.  Work note was provided to limit activities that would worsen his pain.  Patient declined crutches.  He was discharged in good condition. Final Clinical Impression(s) / ED Diagnoses Final diagnoses:  Injury of groin, initial encounter    Rx / DC Orders ED Discharge Orders     None        Gloris Manchester, MD 01/21/21 [phone]

## 2021-11-26 ENCOUNTER — Other Ambulatory Visit: Payer: Self-pay

## 2021-11-26 ENCOUNTER — Encounter (HOSPITAL_BASED_OUTPATIENT_CLINIC_OR_DEPARTMENT_OTHER): Payer: Self-pay

## 2021-11-26 DIAGNOSIS — Z79899 Other long term (current) drug therapy: Secondary | ICD-10-CM | POA: Insufficient documentation

## 2021-11-26 DIAGNOSIS — R001 Bradycardia, unspecified: Secondary | ICD-10-CM | POA: Insufficient documentation

## 2021-11-26 DIAGNOSIS — I10 Essential (primary) hypertension: Secondary | ICD-10-CM | POA: Insufficient documentation

## 2021-11-26 DIAGNOSIS — R519 Headache, unspecified: Secondary | ICD-10-CM | POA: Diagnosis present

## 2021-11-26 DIAGNOSIS — E876 Hypokalemia: Secondary | ICD-10-CM | POA: Insufficient documentation

## 2021-11-26 NOTE — ED Triage Notes (Signed)
Pt to ED by POV from home with c/o HTN. Pt endorses being out of his BP meds for the past 2 months but picked it up to date. Pt endorses nausea and diaphoresis. Arrives hypertensive, all other VSS, NADN.

## 2021-11-27 ENCOUNTER — Emergency Department (HOSPITAL_BASED_OUTPATIENT_CLINIC_OR_DEPARTMENT_OTHER)
Admission: EM | Admit: 2021-11-27 | Discharge: 2021-11-27 | Disposition: A | Discharge status: Home / Self Care | Payer: BC Managed Care – PPO | Attending: Emergency Medicine | Admitting: Emergency Medicine

## 2021-11-27 DIAGNOSIS — I10 Essential (primary) hypertension: Secondary | ICD-10-CM

## 2021-11-27 DIAGNOSIS — E876 Hypokalemia: Secondary | ICD-10-CM

## 2021-11-27 LAB — BASIC METABOLIC PANEL
Anion gap: 6 (ref 5–15)
BUN: 14 mg/dL (ref 6–20)
CO2: 32 mmol/L (ref 22–32)
Calcium: 9.6 mg/dL (ref 8.9–10.3)
Chloride: 103 mmol/L (ref 98–111)
Creatinine, Ser: 1.47 mg/dL — ABNORMAL HIGH (ref 0.61–1.24)
GFR, Estimated: 58 mL/min — ABNORMAL LOW (ref >60)
Glucose, Bld: 103 mg/dL — ABNORMAL HIGH (ref 70–99)
Potassium: 3 mmol/L — ABNORMAL LOW (ref 3.5–5.1)
Sodium: 141 mmol/L (ref 135–145)

## 2021-11-27 LAB — CBC WITH DIFFERENTIAL/PLATELET
Abs Immature Granulocytes: 0.02 10*3/uL (ref 0.00–0.07)
Basophils Absolute: 0 10*3/uL (ref 0.0–0.1)
Basophils Relative: 1 %
Eosinophils Absolute: 0.1 10*3/uL (ref 0.0–0.5)
Eosinophils Relative: 2 %
HCT: 40.3 % (ref 39.0–52.0)
Hemoglobin: 14.3 g/dL (ref 13.0–17.0)
Immature Granulocytes: 0 %
Lymphocytes Relative: 35 %
Lymphs Abs: 2.1 10*3/uL (ref 0.7–4.0)
MCH: 31.6 pg (ref 26.0–34.0)
MCHC: 35.5 g/dL (ref 30.0–36.0)
MCV: 89.2 fL (ref 80.0–100.0)
Monocytes Absolute: 0.5 10*3/uL (ref 0.1–1.0)
Monocytes Relative: 8 %
Neutro Abs: 3.4 10*3/uL (ref 1.7–7.7)
Neutrophils Relative %: 54 %
Platelets: 184 10*3/uL (ref 150–400)
RBC: 4.52 MIL/uL (ref 4.22–5.81)
RDW: 12.4 % (ref 11.5–15.5)
WBC: 6.1 10*3/uL (ref 4.0–10.5)
nRBC: 0 % (ref 0.0–0.2)

## 2021-11-27 MED ORDER — POTASSIUM CHLORIDE CRYS ER 20 MEQ PO TBCR: 20.0000 meq | EXTENDED_RELEASE_TABLET | Freq: Every day | ORAL | 0 refills | Status: AC

## 2021-11-27 MED ORDER — POTASSIUM CHLORIDE CRYS ER 20 MEQ PO TBCR
40.0000 meq | EXTENDED_RELEASE_TABLET | Freq: Once | ORAL | Status: AC
  Administered 2021-11-27: 40 meq via ORAL
  Filled 2021-11-27: qty 2

## 2021-11-27 NOTE — ED Provider Notes (Signed)
[facility] Provider Note: Steven Dell, MD, FACEP  CSN: [id number] MRN: [id number] ARRIVAL: 11/26/21 at 2156 ROOM: [facility]08/[facility]08   CHIEF COMPLAINT  Hypertension   HISTORY OF PRESENT ILLNESS  11/27/21 1:27 AM Steven Roth is a 51 y.o. male with a history of hypertension.  He has been out of his antihypertensives for the past 2 months.  He got his antihypertensives refilled yesterday and took his first dose yesterday evening.  He came to the ED because he developed a headache, nausea and sweating.  Those symptoms have subsequently resolved.  He rated his headache as a 2 out of 10 on arrival.  He denies chest pain or shortness of breath.  He was noted to be bradycardic on arrival but he states he is an athletic man.   Past Medical History:  Diagnosis Date   Allergy    Hallux rigidus, left foot 01/29/2018   History of chicken pox    Hypertension    Mild hyperlipidemia 08/29/2010   OBSTRUCTIVE SLEEP APNEA 07/14/2008   Qualifier: Diagnosis of  By: Shelle Iron MD, Maree Krabbe    Renal insufficiency 02/27/2011   SNORING, HX OF 06/02/2008   Qualifier: Diagnosis of  By: Janit Bern     TINEA BARBAE 05/31/2010   Qualifier: Diagnosis of  By: Nena Jordan     Past Surgical History:  Procedure Laterality Date   NASAL SINUS SURGERY     1996   WISDOM TOOTH EXTRACTION      Family History  Problem Relation Age of Onset   Hypertension Mother    Diabetes Mother    Hypertension Unknown        family history   Heart attack Unknown        maternal uncle in 75's   Heart disease Maternal Uncle     Social History   Tobacco Use   Smoking status: Never   Smokeless tobacco: Never  Vaping Use   Vaping Use: Never used  Substance Use Topics   Alcohol use: No   Drug use: No    Prior to Admission medications   Medication Sig Start Date End Date Taking? Authorizing Provider  potassium chloride SA (KLOR-CON M) 20 MEQ tablet Take 1 tablet (20 mEq total) by mouth daily. 11/27/21  Yes  Kyera Felan, MD  amLODipine (NORVASC) 10 MG tablet Take 1 tablet (10 mg total) by mouth daily. 02/25/11   Yoo, Doe-Hyun R, DO  valsartan-hydrochlorothiazide (DIOVAN HCT) 160-25 MG per tablet Take 1 tablet by mouth daily. 02/25/11   Artist Pais, Doe-Hyun R, DO    Allergies Patient has no known allergies.   REVIEW OF SYSTEMS  Negative except as noted here or in the History of Present Illness.   PHYSICAL EXAMINATION  Initial Vital Signs Blood pressure (!) 172/102, pulse (!) 54, temperature 98.3 F (36.8 C), temperature source Oral, resp. rate 16, SpO2 98 %.  Examination General: Well-developed, well-nourished male in no acute distress; appearance consistent with age of record HENT: normocephalic; atraumatic Eyes: Normal appearance Neck: supple Heart: regular rate and rhythm Lungs: clear to auscultation bilaterally Abdomen: soft; nondistended; nontender; bowel sounds present Extremities: No deformity; full range of motion; pulses normal Neurologic: Awake, alert and oriented; motor function intact in all extremities and symmetric; no facial droop Skin: Warm and dry Psychiatric: Normal mood and affect   RESULTS  Summary of this visit's results, reviewed and interpreted by myself:   EKG Interpretation  Date/Time:  Tuesday November 27 2021 01:02:42 EDT  Ventricular Rate:  52 PR Interval:  177 QRS Duration: 103 QT Interval:  412 QTC Calculation: 384 R Axis:   -6 Text Interpretation: Sinus bradycardia No previous ECGs available Confirmed by Devanee Pomplun, Jonny Ruiz ([id number]) on 11/27/2021 1:06:16 AM       Laboratory Studies: Results for orders placed or performed during the hospital encounter of 11/27/21 (from the past 24 hour(s))  Basic metabolic panel     Status: Abnormal   Collection Time: 11/27/21  2:00 AM  Result Value Ref Range   Sodium 141 135 - 145 mmol/L   Potassium 3.0 (L) 3.5 - 5.1 mmol/L   Chloride 103 98 - 111 mmol/L   CO2 32 22 - 32 mmol/L   Glucose, Bld 103 (H) 70 - 99 mg/dL   BUN  14 6 - 20 mg/dL   Creatinine, Ser 4.69 (H) 0.61 - 1.24 mg/dL   Calcium 9.6 8.9 - 62.9 mg/dL   GFR, Estimated 58 (L) >60 mL/min   Anion gap 6 5 - 15  CBC with Differential     Status: None   Collection Time: 11/27/21  2:00 AM  Result Value Ref Range   WBC 6.1 4.0 - 10.5 K/uL   RBC 4.52 4.22 - 5.81 MIL/uL   Hemoglobin 14.3 13.0 - 17.0 g/dL   HCT 52.8 41.3 - 24.4 %   MCV 89.2 80.0 - 100.0 fL   MCH 31.6 26.0 - 34.0 pg   MCHC 35.5 30.0 - 36.0 g/dL   RDW 01.0 27.2 - 53.6 %   Platelets 184 150 - 400 K/uL   nRBC 0.0 0.0 - 0.2 %   Neutrophils Relative % 54 %   Neutro Abs 3.4 1.7 - 7.7 K/uL   Lymphocytes Relative 35 %   Lymphs Abs 2.1 0.7 - 4.0 K/uL   Monocytes Relative 8 %   Monocytes Absolute 0.5 0.1 - 1.0 K/uL   Eosinophils Relative 2 %   Eosinophils Absolute 0.1 0.0 - 0.5 K/uL   Basophils Relative 1 %   Basophils Absolute 0.0 0.0 - 0.1 K/uL   Immature Granulocytes 0 %   Abs Immature Granulocytes 0.02 0.00 - 0.07 K/uL   Imaging Studies: No results found.  ED COURSE and MDM  Nursing notes, initial and subsequent vitals signs, including pulse oximetry, reviewed and interpreted by myself.  Vitals:   11/26/21 2205 11/27/21 0057 11/27/21 0130 11/27/21 0200  BP: (!) 180/103 (!) 172/102 (!) 174/104 (!) 166/103  Pulse: 64 (!) 54 (!) 51 (!) 57  Resp: 15 16 12 13   Temp: 98.3 F (36.8 C)     TempSrc: Oral     SpO2: 98% 98% 99% 99%   Medications  potassium chloride SA (KLOR-CON M) CR tablet 40 mEq (has no administration in time range)    Patient advised of laboratory findings showing hypokalemia and an elevated creatinine.  We will start potassium repletion in the ED.  He was advised of the importance of maintaining healthy blood pressures in order to maintain kidney health.  PROCEDURES  Procedures   ED DIAGNOSES     ICD-10-CM   1. Hypertension not at goal  I10     2. Hypokalemia  E87.6          Korianna Washer, , MD 11/27/21 [phone]

## 2021-11-27 NOTE — ED Notes (Signed)
Patient verbalizes understanding of discharge instructions. Opportunity for questioning and answers were provided. Armband removed by staff, pt discharged from ED. Ambulated out to lobby with wife
